# Patient Record
Sex: Male | Born: 1975 | Race: White | Hispanic: No | Marital: Married | Smoking: Current some day smoker
Health system: Southern US, Community
[De-identification: ages and names within clinical notes are randomized; demographics above are authoritative.]

## PROBLEM LIST (undated history)

## (undated) DIAGNOSIS — M549 Dorsalgia, unspecified: Secondary | ICD-10-CM

## (undated) DIAGNOSIS — M1990 Unspecified osteoarthritis, unspecified site: Secondary | ICD-10-CM

## (undated) DIAGNOSIS — G8929 Other chronic pain: Secondary | ICD-10-CM

## (undated) DIAGNOSIS — Z87442 Personal history of urinary calculi: Secondary | ICD-10-CM

## (undated) HISTORY — PX: CYST REMOVAL NECK: SHX6281

## (undated) HISTORY — PX: KNEE SURGERY: SHX244

## (undated) HISTORY — PX: FOOT SURGERY: SHX648

---

## 2003-08-14 ENCOUNTER — Other Ambulatory Visit: Payer: Self-pay

## 2005-10-20 ENCOUNTER — Emergency Department: Payer: Self-pay | Admitting: Emergency Medicine

## 2005-10-21 ENCOUNTER — Ambulatory Visit: Payer: Self-pay | Admitting: Podiatry

## 2005-11-18 ENCOUNTER — Encounter: Payer: Self-pay | Admitting: Podiatry

## 2005-12-03 ENCOUNTER — Encounter: Payer: Self-pay | Admitting: Podiatry

## 2006-04-22 ENCOUNTER — Ambulatory Visit: Payer: Self-pay | Admitting: Family Medicine

## 2006-04-23 ENCOUNTER — Ambulatory Visit: Payer: Self-pay | Admitting: Family Medicine

## 2006-06-08 ENCOUNTER — Ambulatory Visit: Payer: Self-pay | Admitting: General Practice

## 2006-07-04 ENCOUNTER — Ambulatory Visit: Payer: Self-pay | Admitting: General Practice

## 2010-05-05 HISTORY — PX: SHOULDER ARTHROSCOPY: SHX128

## 2016-01-09 ENCOUNTER — Emergency Department (HOSPITAL_COMMUNITY)
Admission: EM | Admit: 2016-01-09 | Discharge: 2016-01-09 | Disposition: A | Discharge status: Home / Self Care | Payer: Worker's Compensation | Attending: Physician Assistant | Admitting: Physician Assistant

## 2016-01-09 ENCOUNTER — Encounter (HOSPITAL_COMMUNITY): Payer: Self-pay | Admitting: Emergency Medicine

## 2016-01-09 DIAGNOSIS — X500XXA Overexertion from strenuous movement or load, initial encounter: Secondary | ICD-10-CM | POA: Insufficient documentation

## 2016-01-09 DIAGNOSIS — Y939 Activity, unspecified: Secondary | ICD-10-CM | POA: Diagnosis not present

## 2016-01-09 DIAGNOSIS — F17210 Nicotine dependence, cigarettes, uncomplicated: Secondary | ICD-10-CM | POA: Insufficient documentation

## 2016-01-09 DIAGNOSIS — Y929 Unspecified place or not applicable: Secondary | ICD-10-CM | POA: Diagnosis not present

## 2016-01-09 DIAGNOSIS — Y990 Civilian activity done for income or pay: Secondary | ICD-10-CM | POA: Insufficient documentation

## 2016-01-09 DIAGNOSIS — M545 Low back pain: Secondary | ICD-10-CM | POA: Insufficient documentation

## 2016-01-09 MED ORDER — CYCLOBENZAPRINE HCL 10 MG PO TABS: 10.0000 mg | ORAL_TABLET | Freq: Two times a day (BID) | ORAL | 0 refills | Status: DC | PRN

## 2016-01-09 MED ORDER — IBUPROFEN 800 MG PO TABS: 800.0000 mg | ORAL_TABLET | Freq: Three times a day (TID) | ORAL | 0 refills | Status: DC

## 2016-01-09 NOTE — ED Provider Notes (Signed)
[facility]-EMERGENCY DEPT Provider Note   CSN: [id number] Arrival date & time: 01/09/16  1130    By signing my name below, I, Sonum Patel, attest that this documentation has been prepared under the direction and in the presence of Teressa LowerVrinda Mariabella Nilsen, NP. Electronically Signed: Sonum Patel, Neurosurgeoncribe. 01/09/16. 12:01 PM.  History   Chief Complaint Chief Complaint  Patient presents with  . Back Pain   The history is provided by the patient. No language interpreter was used.   HPI Comments: Anthony Ayala is a 40 y.o. male who presents to the Emergency Department complaining of constant lower back pain that began while at work earlier today. Patient states he was shoveling when he felt a pulling sensation to the affected area. He has not taken any OTC medications for his symptoms. He states movement worsens his pain. He denies numbness, weakness, bowel/bladder incontinence.    No past medical history on file.  There are no active problems to display for this patient.   Past Surgical History:  Procedure Laterality Date  . CYST REMOVAL NECK    . FOOT SURGERY    . KNEE SURGERY      Home Medications    Prior to Admission medications   Not on File    Family History No family history on file.  Social History Social History  Substance Use Topics  . Smoking status: Current Some Day Smoker    Types: Cigarettes  . Smokeless tobacco: Not on file  . Alcohol use Yes     Comment: 4beers/week     Allergies   Penicillins   Review of Systems Review of Systems  Musculoskeletal: Positive for back pain.  Neurological: Negative for weakness and numbness.  All other systems reviewed and are negative.    Physical Exam Updated Vital Signs BP 134/87 (BP Location: Left Arm)   Pulse 89   Temp 98.5 F (36.9 C) (Oral)   Resp 16   Wt 220 lb (99.8 kg)   SpO2 100%   Physical Exam  Constitutional: He is oriented to person, place, and time. He appears well-developed and well-nourished. No  distress.  HENT:  Head: Normocephalic and atraumatic.  Eyes: Conjunctivae and EOM are normal.  Neck: Neck supple. No tracheal deviation present.  Cardiovascular: Normal rate.   Pulmonary/Chest: Effort normal. No respiratory distress.  Musculoskeletal: Normal range of motion.  Left lumbar paraspinal tenderness. Able to do straight leg raises. Good sensation and strength  Neurological: He is alert and oriented to person, place, and time.  Skin: Skin is warm and dry.  Psychiatric: He has a normal mood and affect. His behavior is normal.  Nursing note and vitals reviewed.    ED Treatments / Results  DIAGNOSTIC STUDIES: Oxygen Saturation is 100% on RA, normal by my interpretation.    COORDINATION OF CARE: 12:01 PM Discussed treatment plan with pt at bedside and pt agreed to plan.   Labs (all labs ordered are listed, but only abnormal results are displayed) Labs Reviewed - No data to display  EKG  EKG Interpretation None       Radiology No results found.  Procedures Procedures (including critical care time)  Medications Ordered in ED Medications - No data to display   Initial Impression / Assessment and Plan / ED Course  I have reviewed the triage vital signs and the nursing notes.  Pertinent labs & imaging results that were available during my care of the patient were reviewed by me and considered in my medical decision  making (see chart for details).  Clinical Course   No red flag symptoms. Will treat symptomatically with flexeril and ibuprofen. Discussed follow up and return precautions  Final Clinical Impressions(s) / ED Diagnoses   Final diagnoses:  None    New Prescriptions New Prescriptions   No medications on file   I personally performed the services described in this documentation, which was scribed in my presence. The recorded information has been reviewed and is accurate.    Teressa Lower, NP 01/09/16 1211    Courteney Randall An,  MD 01/11/16 1052

## 2016-01-09 NOTE — ED Triage Notes (Signed)
Pt arrives via POV from workplace where pts states he was lifting things, bent over and felt a pain on the left side of his lower back. Pt denies hx of back pain. Ambulatory, VSS.

## 2016-01-12 ENCOUNTER — Emergency Department
Admission: EM | Admit: 2016-01-12 | Discharge: 2016-01-12 | Disposition: A | Discharge status: Home / Self Care | Payer: Worker's Compensation | Attending: Emergency Medicine | Admitting: Emergency Medicine

## 2016-01-12 ENCOUNTER — Emergency Department: Payer: Worker's Compensation

## 2016-01-12 ENCOUNTER — Encounter: Payer: Self-pay | Admitting: Emergency Medicine

## 2016-01-12 DIAGNOSIS — M545 Low back pain: Secondary | ICD-10-CM | POA: Diagnosis present

## 2016-01-12 DIAGNOSIS — F17210 Nicotine dependence, cigarettes, uncomplicated: Secondary | ICD-10-CM | POA: Insufficient documentation

## 2016-01-12 DIAGNOSIS — M5136 Other intervertebral disc degeneration, lumbar region: Secondary | ICD-10-CM

## 2016-01-12 DIAGNOSIS — M5126 Other intervertebral disc displacement, lumbar region: Secondary | ICD-10-CM

## 2016-01-12 DIAGNOSIS — M5386 Other specified dorsopathies, lumbar region: Secondary | ICD-10-CM | POA: Insufficient documentation

## 2016-01-12 MED ORDER — TIZANIDINE HCL 4 MG PO CAPS: 4.0000 mg | ORAL_CAPSULE | Freq: Four times a day (QID) | ORAL | 0 refills | Status: DC | PRN

## 2016-01-12 MED ORDER — PREDNISONE 10 MG PO TABS: 50.0000 mg | ORAL_TABLET | Freq: Every day | ORAL | 0 refills | Status: DC

## 2016-01-12 NOTE — ED Provider Notes (Signed)
[facility] Emergency Department Provider Note  ____________________________________________  Time seen: Approximately 11:22 AM  I have reviewed the triage vital signs and the nursing notes.   HISTORY  Chief Complaint Back Pain    HPI Anthony Ayala is a 40 y.o. male presents for evaluation of continued low back pain. Patient [facility] days ago. And has continued pain despite Flexeril and ibuprofen. Patient reports that he was lifting trash with a shovel when he felt something pull and pop in his lower back. He describes pain as 6/10 worse when sitting or attempts to ambulate.Marland Kitchen. He feels as if his legs are going to go out. He denies any numbness tingling no radiation of pain. No perianal or groin paresthesia.   History reviewed. No pertinent past medical history.  There are no active problems to display for this patient.   Past Surgical History:  Procedure Laterality Date  . CYST REMOVAL NECK    . FOOT SURGERY    . KNEE SURGERY      Prior to Admission medications   Medication Sig Start Date End Date Taking? Authorizing Provider  ibuprofen (ADVIL,MOTRIN) 800 MG tablet Take 1 tablet (800 mg total) by mouth 3 (three) times daily. 01/09/16   Teressa LowerVrinda Pickering, NP  predniSONE (DELTASONE) 10 MG tablet Take 5 tablets (50 mg total) by mouth daily with breakfast. 01/12/16   Evangeline Dakinharles M Beers, PA-C  tiZANidine (ZANAFLEX) 4 MG capsule Take 1 capsule (4 mg total) by mouth 4 (four) times daily as needed for muscle spasms. 01/12/16   Evangeline Dakinharles M Beers, PA-C    Allergies Penicillins  History reviewed. No pertinent family history.  Social History Social History  Substance Use Topics  . Smoking status: Current Some Day Smoker    Types: Cigarettes  . Smokeless tobacco: Never Used  . Alcohol use Yes     Comment: 4beers/week    Review of Systems Constitutional: No fever/chills Cardiovascular: Denies chest pain. Respiratory: Denies  shortness of breath. Gastrointestinal: No abdominal pain.  No nausea, no vomiting.  No diarrhea.  No constipation. Genitourinary: Negative for dysuria. Musculoskeletal: Positive for low back pain. Skin: Negative for rash. Neurological: Negative for headaches, focal weakness or numbness.  10-point ROS otherwise negative.  ____________________________________________   PHYSICAL EXAM:  VITAL SIGNS: ED Triage Vitals  Enc Vitals Group     BP 01/12/16 1117 136/84     Pulse Rate 01/12/16 1117 77     Resp 01/12/16 1117 18     Temp 01/12/16 1117 98.4 F (36.9 C)     Temp Source 01/12/16 1117 Oral     SpO2 01/12/16 1117 99 %     Weight 01/12/16 1117 220 lb (99.8 kg)     Height 01/12/16 1117 5\' 8"  (1.727 m)     Head Circumference --      Peak Flow --      Pain Score 01/12/16 1118 6     Pain Loc --      Pain Edu? --      Excl. in GC? --     Constitutional: Alert and oriented. Well appearing and in no acute distress. Cardiovascular: Normal rate, regular rhythm. Grossly normal heart sounds.  Good peripheral circulation. Respiratory: Normal respiratory effort.  No retractions. Lungs CTAB. Gastrointestinal: Soft and nontender. No distention. No abdominal bruits. No CVA tenderness. Musculoskeletal: Point tenderness to the lumbar spine. Straight leg raise positive on the left greater than on the right. Distally neurovascularly  intact. Neurologic:  Normal speech and language. No gross focal neurologic deficits are appreciated. No gait instability. Skin:  Skin is warm, dry and intact. No rash noted. Psychiatric: Mood and affect are normal. Speech and behavior are normal.  ____________________________________________   LABS (all labs ordered are listed, but only abnormal results are displayed)  Labs Reviewed - No data to display ____________________________________________  EKG   ____________________________________________  RADIOLOGY  IMPRESSION:  Mild degenerative disc disease  is noted at L3-4. Moderate  broad-based posterior disc bulging is noted at this level which  results in mild central spinal canal stenosis. No fracture or  spondylolisthesis is noted.    ____________________________________________   PROCEDURES  Procedure(s) performed: None  Critical Care performed: No  ____________________________________________   INITIAL IMPRESSION / ASSESSMENT AND PLAN / ED COURSE  Pertinent labs & imaging results that were available during my care of the patient were reviewed by me and considered in my medical decision making (see chart for details). Review of the  CSRS was performed in accordance of the [facility] prior to dispensing any controlled drugs.  Discussed all clinical findings with patient. Rx given for prednisone five-day boost and change muscle relaxers to Zanaflex. Patient follow-up with orthopedics as needed.  Clinical Course    ____________________________________________   FINAL CLINICAL IMPRESSION(S) / ED DIAGNOSES  Final diagnoses:  Bulging lumbar disc     This chart was dictated using voice recognition software/Dragon. Despite best efforts to proofread, errors can occur which can change the meaning. Any change was purely unintentional.    Evangeline Dakin, PA-C 01/12/16 1249    Jeanmarie Plant, MD 01/12/16 [phone]

## 2016-01-12 NOTE — ED Notes (Signed)
Returned from CT.

## 2016-01-12 NOTE — ED Triage Notes (Signed)
Was cleaning up trash at work 3 days ago and when he threw a shovel in truck, he felt like something in back went. Was seen and told pulled muscle but not better.

## 2016-01-12 NOTE — ED Triage Notes (Signed)
Last dose of Flexeril was at 0300 and Ibuprofen at 2300.

## 2016-01-12 NOTE — ED Triage Notes (Signed)
Pt states on Wednesday he was shoveling trash and when he threw up the shovel his back started hurting. Pt states he has been taking Ibuprofen and Flexeril since he was seen at [facility] on Wednesday.

## 2016-01-21 ENCOUNTER — Emergency Department
Admission: EM | Admit: 2016-01-21 | Discharge: 2016-01-21 | Disposition: A | Discharge status: Home / Self Care | Payer: Worker's Compensation | Attending: Emergency Medicine | Admitting: Emergency Medicine

## 2016-01-21 ENCOUNTER — Emergency Department: Payer: Worker's Compensation

## 2016-01-21 ENCOUNTER — Encounter: Payer: Self-pay | Admitting: Emergency Medicine

## 2016-01-21 DIAGNOSIS — Z791 Long term (current) use of non-steroidal anti-inflammatories (NSAID): Secondary | ICD-10-CM | POA: Diagnosis not present

## 2016-01-21 DIAGNOSIS — M5126 Other intervertebral disc displacement, lumbar region: Secondary | ICD-10-CM | POA: Diagnosis not present

## 2016-01-21 DIAGNOSIS — M5136 Other intervertebral disc degeneration, lumbar region: Secondary | ICD-10-CM

## 2016-01-21 DIAGNOSIS — R29898 Other symptoms and signs involving the musculoskeletal system: Secondary | ICD-10-CM

## 2016-01-21 DIAGNOSIS — F17210 Nicotine dependence, cigarettes, uncomplicated: Secondary | ICD-10-CM | POA: Insufficient documentation

## 2016-01-21 DIAGNOSIS — R531 Weakness: Secondary | ICD-10-CM | POA: Diagnosis present

## 2016-01-21 HISTORY — DX: Other chronic pain: G89.29

## 2016-01-21 HISTORY — DX: Dorsalgia, unspecified: M54.9

## 2016-01-21 MED ORDER — DIAZEPAM 2 MG PO TABS: 2.0000 mg | ORAL_TABLET | Freq: Three times a day (TID) | ORAL | 0 refills | Status: DC | PRN

## 2016-01-21 MED ORDER — MELOXICAM 15 MG PO TABS: 15.0000 mg | ORAL_TABLET | Freq: Every day | ORAL | 0 refills | Status: DC

## 2016-01-21 MED ORDER — KETOROLAC TROMETHAMINE 30 MG/ML IJ SOLN
30.0000 mg | Freq: Once | INTRAMUSCULAR | Status: AC
  Administered 2016-01-21: 30 mg via INTRAMUSCULAR
  Filled 2016-01-21: qty 1

## 2016-01-21 MED ORDER — DIAZEPAM 5 MG PO TABS
5.0000 mg | ORAL_TABLET | Freq: Once | ORAL | Status: AC
  Administered 2016-01-21: 5 mg via ORAL
  Filled 2016-01-21: qty 1

## 2016-01-21 NOTE — ED Notes (Signed)
To MRI via stretcher  

## 2016-01-21 NOTE — ED Triage Notes (Signed)
Brought in via ems with lower back pain   States pain this am went into left leg..Marland Kitchen

## 2016-01-21 NOTE — ED Notes (Signed)
Back from MRI.

## 2016-01-21 NOTE — Discharge Instructions (Signed)
Continue the prednisone. Stop the flexeril and ibuprofen. Schedule an appointment with neurosurgery and arrange for your PT.

## 2016-01-21 NOTE — ED Notes (Signed)
Spoke with MRI tech  Screening per phone by pt  ..Anthony Ayala

## 2016-01-21 NOTE — ED Provider Notes (Signed)
[facility] Emergency Department Provider Note ____________________________________________  Time seen: Approximately 8:30 AM  I have reviewed the triage vital signs and the nursing notes.   HISTORY  Chief Complaint Back Pain    HPI Anthony Ayala is a 40 y.o. male who presents to the ER for evaluation of lower back pain that radiates into his left leg. Pain started9/3/17 while shoveling. He denies loss of bowel or bladder control or saddle anesthesia. He took flexeril and ibuprofen last night and prednisone this morning. CT done here 01/09/16 showing "bulging discs." He has follow up with neurosurgery and the next step is "MRI." Pain had improved until this morning. He stepped down into the yard and developed sudden, sharp, pain in the lower back and is now radiating into the left leg causing weakness and inability to bear weight. Prior to this morning, pain did not radiate.   Past Medical History:  Diagnosis Date  . Chronic back pain     There are no active problems to display for this patient.   Past Surgical History:  Procedure Laterality Date  . CYST REMOVAL NECK    . FOOT SURGERY    . KNEE SURGERY      Prior to Admission medications   Medication Sig Start Date End Date Taking? Authorizing Provider  diazepam (VALIUM) 2 MG tablet Take 1 tablet (2 mg total) by mouth every 8 (eight) hours as needed. 01/21/16   Chinita Pester, FNP  ibuprofen (ADVIL,MOTRIN) 800 MG tablet Take 1 tablet (800 mg total) by mouth 3 (three) times daily. 01/09/16   Teressa Lower, NP  meloxicam (MOBIC) 15 MG tablet Take 1 tablet (15 mg total) by mouth daily. 01/21/16   Chinita Pester, FNP  predniSONE (DELTASONE) 10 MG tablet Take 5 tablets (50 mg total) by mouth daily with breakfast. 01/12/16   Evangeline Dakin, PA-C  tiZANidine (ZANAFLEX) 4 MG capsule Take 1 capsule (4 mg total) by mouth 4 (four) times daily as needed for muscle spasms. 01/12/16   Evangeline Dakin, PA-C     Allergies Penicillins  No family history on file.  Social History Social History  Substance Use Topics  . Smoking status: Current Some Day Smoker    Types: Cigarettes  . Smokeless tobacco: Never Used  . Alcohol use Yes     Comment: 4beers/week    Review of Systems Constitutional: No recent illness. Cardiovascular: Denies chest pain or palpitations. Respiratory: Denies shortness of breath. Musculoskeletal: Pain in lower back with radiation into the left leg. Skin: Negative for rash, wound, lesion. Neurological: Negative for focal weakness or numbness.  ____________________________________________   PHYSICAL EXAM:  VITAL SIGNS: ED Triage Vitals  Enc Vitals Group     BP 01/21/16 0813 136/76     Pulse Rate 01/21/16 0813 79     Resp 01/21/16 0813 20     Temp 01/21/16 0813 97.8 F (36.6 C)     Temp Source 01/21/16 0813 Oral     SpO2 01/21/16 0813 97 %     Weight 01/21/16 0811 220 lb (99.8 kg)     Height 01/21/16 0811 5\' 8"  (1.727 m)     Head Circumference --      Peak Flow --      Pain Score 01/21/16 0811 8     Pain Loc --      Pain Edu? --      Excl. in GC? --     Constitutional: Alert and oriented. Well appearing and  in no acute distress. Eyes: Conjunctivae are normal. EOMI. Head: Atraumatic. Neck: No stridor.  Respiratory: Normal respiratory effort.   Musculoskeletal: Diffuse lower lumbar tenderness without focal midline tenderness of the lumbar spine. Good sensation, straight leg raise triggers pain at about 45*.  Neurologic:  Normal speech and language. No gross focal neurologic deficits are appreciated. Speech is normal. Straight left leg raise barely possible due to pain/weakness. Skin:  Skin is warm, dry and intact. Atraumatic. Psychiatric: Mood and affect are normal. Speech and behavior are normal.  ____________________________________________   LABS (all labs ordered are listed, but only abnormal results are displayed)  Labs Reviewed - No data  to display ____________________________________________  RADIOLOGY  Previous CT results reviewed.  MR Lumbar Spine: IMPRESSION: 1. At L3-4 there is a broad disc protrusion with mild caudal migration of disc material effacing the ventral CSF space. Bilateral lateral recess stenosis and mild-moderate spinal stenosis. 2. At L4-5 there is a broad central disc protrusion with bilateral lateral recess stenosis, left greater than right. ____________________________________________   PROCEDURES  Procedure(s) performed: None   ____________________________________________   INITIAL IMPRESSION / ASSESSMENT AND PLAN / ED COURSE  Will give toradol injection and valium by mouth and reassess pain.    Clinical Course  Comment By Time  Little to no improvement after medications. Patient states he is not able to walk or bear weight. Will order MR Lumbar Chinita PesterCari B Rawley Harju, FNP 09/18 0947  Observed ambulating in the hallway with antalgic but steady gait. Chinita PesterCari B Mao Lockner, FNP 09/18 1140    Pertinent labs & imaging results that were available during my care of the patient were reviewed by me and considered in my medical decision making (see chart for details).  Patient will be discharged home to follow up with neurosurgery. He was advised to follow through with the physical therapy as previously recommended. He is to return to the ER for symptoms that change or worsen or for new concerns.  ____________________________________________   FINAL CLINICAL IMPRESSION(S) / ED DIAGNOSES  Final diagnoses:  Weakness of left leg  Bulging lumbar disc       Chinita PesterCari B Ryenne Lynam, FNP 01/22/16 [id number]    Nita Sicklearolina Veronese, MD 01/22/16 [phone]

## 2016-04-01 ENCOUNTER — Encounter
Admission: RE | Admit: 2016-04-01 | Discharge: 2016-04-01 | Disposition: A | Discharge status: Home / Self Care | Payer: Medicaid Other | Source: Ambulatory Visit | Attending: Neurological Surgery | Admitting: Neurological Surgery

## 2016-04-01 ENCOUNTER — Ambulatory Visit
Admission: RE | Admit: 2016-04-01 | Discharge: 2016-04-01 | Disposition: A | Discharge status: Home / Self Care | Payer: Medicaid Other | Source: Ambulatory Visit | Attending: Neurological Surgery | Admitting: Neurological Surgery

## 2016-04-01 DIAGNOSIS — Z01818 Encounter for other preprocedural examination: Secondary | ICD-10-CM

## 2016-04-01 DIAGNOSIS — J9811 Atelectasis: Secondary | ICD-10-CM | POA: Diagnosis not present

## 2016-04-01 DIAGNOSIS — M5416 Radiculopathy, lumbar region: Secondary | ICD-10-CM | POA: Insufficient documentation

## 2016-04-01 DIAGNOSIS — Z01810 Encounter for preprocedural cardiovascular examination: Secondary | ICD-10-CM | POA: Diagnosis not present

## 2016-04-01 DIAGNOSIS — Z01812 Encounter for preprocedural laboratory examination: Secondary | ICD-10-CM | POA: Insufficient documentation

## 2016-04-01 HISTORY — DX: Unspecified osteoarthritis, unspecified site: M19.90

## 2016-04-01 HISTORY — DX: Personal history of urinary calculi: Z87.442

## 2016-04-01 LAB — BASIC METABOLIC PANEL
Anion gap: 7 (ref 5–15)
BUN: 14 mg/dL (ref 6–20)
CO2: 25 mmol/L (ref 22–32)
Calcium: 9.3 mg/dL (ref 8.9–10.3)
Chloride: 106 mmol/L (ref 101–111)
Creatinine, Ser: 0.7 mg/dL (ref 0.61–1.24)
GFR calc Af Amer: >60 mL/min (ref >60)
GLUCOSE: 101 mg/dL — AB (ref 65–99)
POTASSIUM: 4 mmol/L (ref 3.5–5.1)
Sodium: 138 mmol/L (ref 135–145)

## 2016-04-01 LAB — CBC
HEMATOCRIT: 42.5 % (ref 40.0–52.0)
Hemoglobin: 14.9 g/dL (ref 13.0–18.0)
MCH: 31.4 pg (ref 26.0–34.0)
MCHC: 35 g/dL (ref 32.0–36.0)
MCV: 89.8 fL (ref 80.0–100.0)
Platelets: 225 10*3/uL (ref 150–440)
RBC: 4.74 MIL/uL (ref 4.40–5.90)
RDW: 13.3 % (ref 11.5–14.5)
WBC: 6 10*3/uL (ref 3.8–10.6)

## 2016-04-01 LAB — URINALYSIS COMPLETE WITH MICROSCOPIC (ARMC ONLY)
BILIRUBIN URINE: NEGATIVE
Bacteria, UA: NONE SEEN
GLUCOSE, UA: NEGATIVE mg/dL
Hgb urine dipstick: NEGATIVE
Leukocytes, UA: NEGATIVE
Nitrite: NEGATIVE
Protein, ur: NEGATIVE mg/dL
Specific Gravity, Urine: 1.024 (ref 1.005–1.030)
Squamous Epithelial / LPF: NONE SEEN
pH: 5 (ref 5.0–8.0)

## 2016-04-01 LAB — TYPE AND SCREEN
ABO/RH(D): A POS
ANTIBODY SCREEN: NEGATIVE

## 2016-04-01 LAB — PROTIME-INR
INR: 0.9
Prothrombin Time: 12.1 seconds (ref 11.4–15.2)

## 2016-04-01 LAB — SURGICAL PCR SCREEN
MRSA, PCR: NEGATIVE
STAPHYLOCOCCUS AUREUS: NEGATIVE

## 2016-04-01 LAB — APTT: aPTT: 26 seconds (ref 24–36)

## 2016-04-01 NOTE — Patient Instructions (Signed)
Your procedure is scheduled on: Wednesday April 09, 2016 Su procedimiento est programado para: Report to MEDICAL MALL REVOLVING DOOR GO TO SECOND FLOOR Presntese a: To find out your arrival time please call [phone] between 1PM - 3PM on Tuesday April 08, 2016  Para saber su hora de llegada por favor llame al [phone] entre la 1PM - 3PM el da:  Remember: Instructions that are not followed completely may result in serious medical risk, up to and including death, or upon the discretion of your surgeon and anesthesiologist your surgery may need to be rescheduled.  Recuerde: Las instrucciones que no se siguen completamente Armed forces logistics/support/administrative officerpueden resultar en un riesgo de salud grave, incluyendo hasta la [place] o a discrecin de su cirujano y Scientific laboratory techniciananestesilogo, su ciruga se puede posponer.   __X__ 1. Do not eat food or drink liquids after midnight. No gum chewing or hard candies.  No coma alimentos ni tome lquidos despus de la medianoche.  No mastique chicle ni caramelos  duros.     __X__ 2. No alcohol for 24 hours before or after surgery.    No tome alcohol durante las 24 horas antes ni despus de la [place].   ____ 3. Bring all medications with you on the day of surgery if instructed.    Lleve todos los medicamentos con usted el da de su ciruga si se le ha indicado as.   _X_ 4. Notify your doctor if there is any change in your medical condition (cold, fever,                             infections).    Informe a su mdico si hay algn cambio en su condicin mdica (resfriado, fiebre, infecciones).   Do not wear jewelry, make-up, hairpins, clips or nail polish.  No use joyas, maquillajes, pinzas/ganchos para el cabello ni esmalte de uas.  Do not wear lotions, powders, or perfumes. You may wear deodorant.  No use lociones, polvos o perfumes.  Puede usar desodorante.    Do not shave 48 hours prior to surgery. Men may shave face and neck.  No se afeite 48 horas antes de la [place].  Los  hombres pueden Commercial Metals Companyafeitarse la cara y el cuello.   Do not bring valuables to the hospital.   No lleve objetos de valor al hospital.  [facility] is not responsible for any belongings or valuables.  [place] no se hace responsable de ningn tipo de pertenencias u objetos de Licensed conveyancervalor.               Contacts, dentures or bridgework may not be worn into surgery.  Los lentes de [place]contacto, las dentaduras postizas o puentes no se pueden usar en la [place].  Leave your suitcase in the car. After surgery it may be brought to your room.  Deje su maleta en el auto.  Despus de la ciruga podr traerla a su habitacin.  For patients admitted to the hospital, discharge time is determined by your treatment team.  Para los pacientes que sean ingresados al hospital, el tiempo en el cual se le dar de alta es determinado por su                equipo de [place]ratamiento.   Patients discharged the day of surgery will not be allowed to drive home. A los pacientes que se les da de alta el mismo da de la ciruga no se les Investment banker, operationalpermitir conducir a  casa.   Please read over the following fact sheets that you were given: Por favor lea las siguientes hojas de informacin que le dieron:   MRSA INFORMATION AND CHG INSTRUCTIONS   ____ Take these medicines the morning of surgery with A SIP OF WATER:          Johnson & Johnsonome estas medicinas la maana de la ciruga con UN SORBO DE AGUA:  1. NO MED  2.   3.   4.       5.  6.  ____ Fleet Enema (as directed)          Enema de Fleet (segn lo indicado)    _X___ Use CHG Soap as directed          Utilice el jabn de CHG segn lo indicado  ____ Use inhalers on the day of surgery          Use los inhaladores el da de la ciruga  ____ Stop metformin 2 days prior to surgery          Deje de tomar el metformin 2 das antes de la ciruga    ____ Take 1/2 of usual insulin dose the night before surgery and none on the morning of surgery           Tome la mitad de la dosis habitual de  insulina la noche antes de la [place] y no tome nada en la maana de la             ciruga  ____ Stop Coumadin/Plavix/aspirin on          Deje de tomar el Coumadin/Plavix/aspirina el da:  __X__ Stop Anti-inflammatories on 04-02-2016 UNTIL AFTER SURGERY USE TYLENOL          Deje de tomar antiinflamatorios el da:   ____ Stop supplements until after surgery            Deje de tomar suplementos hasta despus de la ciruga  ____ Bring C-Pap to the hospital          Lleve el C-Pap al hospital

## 2016-04-01 NOTE — Pre-Procedure Instructions (Signed)
CXR OK BY DR Henrene HawkingKEPHART

## 2016-04-09 ENCOUNTER — Ambulatory Visit: Payer: Medicaid Other | Admitting: Anesthesiology

## 2016-04-09 ENCOUNTER — Encounter: Payer: Self-pay | Admitting: *Deleted

## 2016-04-09 ENCOUNTER — Observation Stay
Admission: RE | Admit: 2016-04-09 | Discharge: 2016-04-10 | Disposition: A | Discharge status: Home / Self Care | Payer: Medicaid Other | Source: Ambulatory Visit | Attending: Neurological Surgery | Admitting: Neurological Surgery

## 2016-04-09 ENCOUNTER — Encounter
Admission: RE | Disposition: A | Discharge status: Home / Self Care | Payer: Self-pay | Source: Ambulatory Visit | Attending: Neurological Surgery

## 2016-04-09 ENCOUNTER — Ambulatory Visit: Payer: Medicaid Other

## 2016-04-09 DIAGNOSIS — F17200 Nicotine dependence, unspecified, uncomplicated: Secondary | ICD-10-CM | POA: Diagnosis not present

## 2016-04-09 DIAGNOSIS — Z880 Allergy status to penicillin: Secondary | ICD-10-CM | POA: Insufficient documentation

## 2016-04-09 DIAGNOSIS — M5416 Radiculopathy, lumbar region: Secondary | ICD-10-CM | POA: Diagnosis present

## 2016-04-09 DIAGNOSIS — Z79899 Other long term (current) drug therapy: Secondary | ICD-10-CM | POA: Diagnosis not present

## 2016-04-09 DIAGNOSIS — M5116 Intervertebral disc disorders with radiculopathy, lumbar region: Principal | ICD-10-CM | POA: Insufficient documentation

## 2016-04-09 DIAGNOSIS — Z419 Encounter for procedure for purposes other than remedying health state, unspecified: Secondary | ICD-10-CM

## 2016-04-09 DIAGNOSIS — R262 Difficulty in walking, not elsewhere classified: Secondary | ICD-10-CM

## 2016-04-09 HISTORY — PX: LUMBAR LAMINECTOMY/DECOMPRESSION MICRODISCECTOMY: SHX5026

## 2016-04-09 LAB — CREATININE, SERUM
Creatinine, Ser: 0.97 mg/dL (ref 0.61–1.24)
GFR calc Af Amer: >60 mL/min (ref >60)
GFR calc non Af Amer: >60 mL/min (ref >60)

## 2016-04-09 LAB — CBC
HCT: 44.5 % (ref 40.0–52.0)
Hemoglobin: 15.1 g/dL (ref 13.0–18.0)
MCH: 30.7 pg (ref 26.0–34.0)
MCHC: 33.9 g/dL (ref 32.0–36.0)
MCV: 90.5 fL (ref 80.0–100.0)
PLATELETS: 248 10*3/uL (ref 150–440)
RBC: 4.91 MIL/uL (ref 4.40–5.90)
RDW: 13.5 % (ref 11.5–14.5)
WBC: 11 10*3/uL — ABNORMAL HIGH (ref 3.8–10.6)

## 2016-04-09 LAB — ABO/RH: ABO/RH(D): A POS

## 2016-04-09 SURGERY — LUMBAR LAMINECTOMY/DECOMPRESSION MICRODISCECTOMY 2 LEVELS
Anesthesia: General

## 2016-04-09 MED ORDER — ALUM & MAG HYDROXIDE-SIMETH 200-200-20 MG/5ML PO SUSP: 30.0000 mL | Freq: Four times a day (QID) | ORAL | Status: DC | PRN

## 2016-04-09 MED ORDER — SODIUM CHLORIDE 0.9 % IV SOLN
INTRAVENOUS | Status: DC
  Administered 2016-04-09: 17:00:00 via INTRAVENOUS

## 2016-04-09 MED ORDER — ACETAMINOPHEN 10 MG/ML IV SOLN
INTRAVENOUS | Status: AC
  Filled 2016-04-09: qty 100

## 2016-04-09 MED ORDER — DEXAMETHASONE SODIUM PHOSPHATE 10 MG/ML IJ SOLN
INTRAMUSCULAR | Status: DC | PRN
Start: 2016-04-09 — End: 2016-04-09
  Administered 2016-04-09 (×2): 4 mg via INTRAVENOUS

## 2016-04-09 MED ORDER — METHYLPREDNISOLONE ACETATE 40 MG/ML IJ SUSP
INTRAMUSCULAR | Status: DC | PRN
  Administered 2016-04-09: 1 mL

## 2016-04-09 MED ORDER — METHYLPREDNISOLONE ACETATE 40 MG/ML IJ SUSP
INTRAMUSCULAR | Status: AC
  Filled 2016-04-09: qty 1

## 2016-04-09 MED ORDER — SODIUM CHLORIDE 0.9 % IJ SOLN
INTRAMUSCULAR | Status: AC
  Filled 2016-04-09: qty 50

## 2016-04-09 MED ORDER — ACETAMINOPHEN 10 MG/ML IV SOLN
INTRAVENOUS | Status: DC | PRN
  Administered 2016-04-09: 1000 mg via INTRAVENOUS

## 2016-04-09 MED ORDER — GENTAMICIN SULFATE 40 MG/ML IJ SOLN
600.0000 mg | Freq: Once | INTRAVENOUS | Status: AC
  Administered 2016-04-09: 600 mg via INTRAVENOUS
  Filled 2016-04-09: qty 15

## 2016-04-09 MED ORDER — SODIUM CHLORIDE 0.9 % IJ SOLN
INTRAMUSCULAR | Status: AC
  Filled 2016-04-09: qty 10

## 2016-04-09 MED ORDER — SODIUM CHLORIDE 0.9 % IV SOLN
1500.0000 mg | Freq: Once | INTRAVENOUS | Status: AC
  Administered 2016-04-09: 1500 mg via INTRAVENOUS
  Filled 2016-04-09: qty 1500

## 2016-04-09 MED ORDER — FAMOTIDINE 20 MG PO TABS
ORAL_TABLET | ORAL | Status: AC
  Administered 2016-04-09: 20 mg via ORAL
  Filled 2016-04-09: qty 1

## 2016-04-09 MED ORDER — SUCCINYLCHOLINE CHLORIDE 20 MG/ML IJ SOLN
INTRAMUSCULAR | Status: DC | PRN
  Administered 2016-04-09: 100 mg via INTRAVENOUS

## 2016-04-09 MED ORDER — VANCOMYCIN HCL IN DEXTROSE 1-5 GM/200ML-% IV SOLN
1000.0000 mg | Freq: Once | INTRAVENOUS | Status: AC
  Administered 2016-04-09: 1000 mg via INTRAVENOUS

## 2016-04-09 MED ORDER — THROMBIN 5000 UNITS EX SOLR
CUTANEOUS | Status: AC
  Filled 2016-04-09: qty 5000

## 2016-04-09 MED ORDER — ACETAMINOPHEN 325 MG PO TABS: 650.0000 mg | ORAL_TABLET | ORAL | Status: DC | PRN

## 2016-04-09 MED ORDER — THROMBIN 5000 UNITS EX SOLR
CUTANEOUS | Status: DC | PRN
  Administered 2016-04-09: 5000 [IU] via TOPICAL

## 2016-04-09 MED ORDER — BACITRACIN 50000 UNITS IM SOLR
INTRAMUSCULAR | Status: DC | PRN
  Administered 2016-04-09: 1000 mL

## 2016-04-09 MED ORDER — ONDANSETRON HCL 4 MG/2ML IJ SOLN: 4.0000 mg | INTRAMUSCULAR | Status: DC | PRN

## 2016-04-09 MED ORDER — SODIUM CHLORIDE 0.9% FLUSH: 3.0000 mL | Freq: Two times a day (BID) | INTRAVENOUS | Status: DC

## 2016-04-09 MED ORDER — DOCUSATE SODIUM 100 MG PO CAPS
100.0000 mg | ORAL_CAPSULE | Freq: Two times a day (BID) | ORAL | Status: DC
  Administered 2016-04-09 – 2016-04-10 (×2): 100 mg via ORAL
  Filled 2016-04-09 (×2): qty 1

## 2016-04-09 MED ORDER — SODIUM CHLORIDE 0.9 % IV SOLN
INTRAVENOUS | Status: DC | PRN
  Administered 2016-04-09: 120 mL

## 2016-04-09 MED ORDER — ACETAMINOPHEN 650 MG RE SUPP: 650.0000 mg | RECTAL | Status: DC | PRN

## 2016-04-09 MED ORDER — FENTANYL CITRATE (PF) 100 MCG/2ML IJ SOLN
INTRAMUSCULAR | Status: DC | PRN
  Administered 2016-04-09: 150 ug via INTRAVENOUS
  Administered 2016-04-09 (×5): 50 ug via INTRAVENOUS

## 2016-04-09 MED ORDER — BISACODYL 5 MG PO TBEC: 5.0000 mg | DELAYED_RELEASE_TABLET | Freq: Every day | ORAL | Status: DC | PRN

## 2016-04-09 MED ORDER — ONDANSETRON HCL 4 MG/2ML IJ SOLN
INTRAMUSCULAR | Status: DC | PRN
  Administered 2016-04-09: 4 mg via INTRAVENOUS

## 2016-04-09 MED ORDER — HYDROCODONE-ACETAMINOPHEN 5-325 MG PO TABS
1.0000 | ORAL_TABLET | ORAL | Status: DC | PRN
Start: 2016-04-09 — End: 2016-04-10
  Administered 2016-04-09: 1 via ORAL
  Administered 2016-04-10: 2 via ORAL
  Filled 2016-04-09: qty 2
  Filled 2016-04-09: qty 1

## 2016-04-09 MED ORDER — FENTANYL CITRATE (PF) 100 MCG/2ML IJ SOLN
25.0000 ug | INTRAMUSCULAR | Status: DC | PRN
  Administered 2016-04-09 (×4): 25 ug via INTRAVENOUS

## 2016-04-09 MED ORDER — ROCURONIUM BROMIDE 100 MG/10ML IV SOLN
INTRAVENOUS | Status: DC | PRN
  Administered 2016-04-09: 40 mg via INTRAVENOUS
  Administered 2016-04-09: 10 mg via INTRAVENOUS

## 2016-04-09 MED ORDER — SENNOSIDES-DOCUSATE SODIUM 8.6-50 MG PO TABS: 1.0000 | ORAL_TABLET | Freq: Every evening | ORAL | Status: DC | PRN

## 2016-04-09 MED ORDER — ONDANSETRON HCL 4 MG/2ML IJ SOLN: 4.0000 mg | Freq: Once | INTRAMUSCULAR | Status: DC | PRN

## 2016-04-09 MED ORDER — SODIUM CHLORIDE 0.9 % IV SOLN: 250.0000 mL | INTRAVENOUS | Status: DC

## 2016-04-09 MED ORDER — GELATIN ABSORBABLE 12-7 MM EX MISC
CUTANEOUS | Status: AC
  Filled 2016-04-09: qty 1

## 2016-04-09 MED ORDER — OXYCODONE-ACETAMINOPHEN 5-325 MG PO TABS
1.0000 | ORAL_TABLET | ORAL | Status: DC | PRN
  Administered 2016-04-09 – 2016-04-10 (×2): 2 via ORAL
  Filled 2016-04-09 (×2): qty 2

## 2016-04-09 MED ORDER — FENTANYL CITRATE (PF) 100 MCG/2ML IJ SOLN
INTRAMUSCULAR | Status: AC
  Administered 2016-04-09: 25 ug via INTRAVENOUS
  Filled 2016-04-09: qty 2

## 2016-04-09 MED ORDER — BUPIVACAINE HCL (PF) 0.5 % IJ SOLN
INTRAMUSCULAR | Status: AC
  Filled 2016-04-09: qty 30

## 2016-04-09 MED ORDER — BUPIVACAINE HCL (PF) 0.5 % IJ SOLN
INTRAMUSCULAR | Status: AC
Start: 2016-04-09 — End: 2016-04-09
  Filled 2016-04-09: qty 30

## 2016-04-09 MED ORDER — LACTATED RINGERS IV SOLN
INTRAVENOUS | Status: DC
  Administered 2016-04-09: 09:00:00 via INTRAVENOUS

## 2016-04-09 MED ORDER — FAMOTIDINE 20 MG PO TABS
20.0000 mg | ORAL_TABLET | Freq: Once | ORAL | Status: AC
  Administered 2016-04-09: 20 mg via ORAL

## 2016-04-09 MED ORDER — KETOROLAC TROMETHAMINE 30 MG/ML IJ SOLN: 30.0000 mg | Freq: Once | INTRAMUSCULAR | Status: DC

## 2016-04-09 MED ORDER — BACITRACIN 50000 UNITS IM SOLR
INTRAMUSCULAR | Status: AC
  Filled 2016-04-09: qty 1

## 2016-04-09 MED ORDER — HYDROMORPHONE HCL 1 MG/ML IJ SOLN: 0.5000 mg | INTRAMUSCULAR | Status: DC | PRN

## 2016-04-09 MED ORDER — PHENOL 1.4 % MT LIQD
1.0000 | OROMUCOSAL | Status: DC | PRN
  Filled 2016-04-09: qty 177

## 2016-04-09 MED ORDER — SUGAMMADEX SODIUM 200 MG/2ML IV SOLN
INTRAVENOUS | Status: DC | PRN
  Administered 2016-04-09: 100 mg via INTRAVENOUS

## 2016-04-09 MED ORDER — MENTHOL 3 MG MT LOZG
1.0000 | LOZENGE | OROMUCOSAL | Status: DC | PRN
  Filled 2016-04-09: qty 9

## 2016-04-09 MED ORDER — HEPARIN SODIUM (PORCINE) 5000 UNIT/ML IJ SOLN
5000.0000 [IU] | Freq: Three times a day (TID) | INTRAMUSCULAR | Status: DC
  Administered 2016-04-09 – 2016-04-10 (×2): 5000 [IU] via SUBCUTANEOUS
  Filled 2016-04-09 (×2): qty 1

## 2016-04-09 MED ORDER — BUPIVACAINE-EPINEPHRINE (PF) 0.5% -1:200000 IJ SOLN
INTRAMUSCULAR | Status: DC | PRN
  Administered 2016-04-09: 10 mL via PERINEURAL

## 2016-04-09 MED ORDER — GELATIN ABSORBABLE 12-7 MM EX MISC
CUTANEOUS | Status: DC | PRN
  Administered 2016-04-09: 1

## 2016-04-09 MED ORDER — DIAZEPAM 5 MG PO TABS: 5.0000 mg | ORAL_TABLET | Freq: Four times a day (QID) | ORAL | Status: DC | PRN

## 2016-04-09 MED ORDER — MIDAZOLAM HCL 2 MG/2ML IJ SOLN
INTRAMUSCULAR | Status: DC | PRN
  Administered 2016-04-09: 2 mg via INTRAVENOUS

## 2016-04-09 MED ORDER — BUPIVACAINE LIPOSOME 1.3 % IJ SUSP
INTRAMUSCULAR | Status: AC
  Filled 2016-04-09: qty 20

## 2016-04-09 MED ORDER — PROPOFOL 10 MG/ML IV BOLUS
INTRAVENOUS | Status: DC | PRN
  Administered 2016-04-09: 200 mg via INTRAVENOUS

## 2016-04-09 MED ORDER — SODIUM CHLORIDE 0.9% FLUSH: 3.0000 mL | INTRAVENOUS | Status: DC | PRN

## 2016-04-09 MED ORDER — EPINEPHRINE PF 1 MG/ML IJ SOLN
INTRAMUSCULAR | Status: AC
  Filled 2016-04-09: qty 1

## 2016-04-09 MED ORDER — VANCOMYCIN HCL IN DEXTROSE 1-5 GM/200ML-% IV SOLN
INTRAVENOUS | Status: AC
  Filled 2016-04-09: qty 200

## 2016-04-09 SURGICAL SUPPLY — 67 items
AGENT HMST MTR 8 SURGIFLO (HEMOSTASIS) ×2
APL SRG 60D 8 XTD TIP BNDBL (TIP)
BAND RUBBER 3X1/6 STRL (MISCELLANEOUS) ×1 IMPLANT
BAND RUBBER 3X1/6 TAN STRL (MISCELLANEOUS) ×3 IMPLANT
BLADE BOVIE TIP EXT 4 (BLADE) ×3 IMPLANT
BLADE CLIPPER SURG (BLADE) ×2 IMPLANT
BUR NEURO DRILL SOFT 3.0X3.8M (BURR) ×3 IMPLANT
CANISTER SUCT 1200ML W/VALVE (MISCELLANEOUS) ×3 IMPLANT
CHLORAPREP W/TINT 26ML (MISCELLANEOUS) ×6 IMPLANT
CLOSURE WOUND 1/2 X4 (GAUZE/BANDAGES/DRESSINGS)
CNTNR SPEC 2.5X3XGRAD LEK (MISCELLANEOUS) ×1
CONT SPEC 4OZ STER OR WHT (MISCELLANEOUS) ×2
CONT SPEC 4OZ STRL OR WHT (MISCELLANEOUS) ×1
CONTAINER SPEC 2.5X3XGRAD LEK (MISCELLANEOUS) ×1 IMPLANT
COUNTER NEEDLE 20/40 LG (NEEDLE) ×1 IMPLANT
COVER LIGHT HANDLE STERIS (MISCELLANEOUS) ×2 IMPLANT
CUP MEDICINE 2OZ PLAST GRAD ST (MISCELLANEOUS) ×3 IMPLANT
DRAPE C-ARM XRAY 36X54 (DRAPES) ×10 IMPLANT
DRAPE LAPAROTOMY 100X77 ABD (DRAPES) ×1 IMPLANT
DRAPE MICROSCOPE LEICA (MISCELLANEOUS) ×3 IMPLANT
DRAPE POUCH INSTRU U-SHP 10X18 (DRAPES) ×1 IMPLANT
DRAPE SURG 17X11 SM STRL (DRAPES) ×3 IMPLANT
DRAPE TABLE BACK 80X90 (DRAPES) ×3 IMPLANT
DRESSING TELFA 4X3 1S ST N-ADH (GAUZE/BANDAGES/DRESSINGS) ×2 IMPLANT
DRSG TEGADERM 4X4.75 (GAUZE/BANDAGES/DRESSINGS) IMPLANT
DURASEAL APPLICATOR TIP (TIP) IMPLANT
DURASEAL SPINE SEALANT 3ML (MISCELLANEOUS) IMPLANT
ELECT CAUTERY BLADE TIP 2.5 (TIP) ×3
ELECT EZSTD 165MM 6.5IN (MISCELLANEOUS) ×3
ELECT REM PT RETURN 9FT ADLT (ELECTROSURGICAL) ×3
ELECTRODE CAUTERY BLDE TIP 2.5 (TIP) ×1 IMPLANT
ELECTRODE EZSTD 165MM 6.5IN (MISCELLANEOUS) ×1 IMPLANT
ELECTRODE REM PT RTRN 9FT ADLT (ELECTROSURGICAL) ×1 IMPLANT
GLOVE BIO SURGEON STRL SZ8 (GLOVE) ×6 IMPLANT
GLOVE BIOGEL PI IND STRL 8 (GLOVE) ×1 IMPLANT
GLOVE BIOGEL PI INDICATOR 8 (GLOVE) ×2
GOWN STRL REUS W/ TWL LRG LVL3 (GOWN DISPOSABLE) ×1 IMPLANT
GOWN STRL REUS W/ TWL XL LVL3 (GOWN DISPOSABLE) ×1 IMPLANT
GOWN STRL REUS W/TWL LRG LVL3 (GOWN DISPOSABLE) ×3
GOWN STRL REUS W/TWL XL LVL3 (GOWN DISPOSABLE) ×3
GRADUATE 1200CC STRL 31836 (MISCELLANEOUS) ×1 IMPLANT
KIT RM TURNOVER STRD PROC AR (KITS) ×3 IMPLANT
KIT WILSON FRAME (KITS) ×3 IMPLANT
MARKER SKIN DUAL TIP RULER LAB (MISCELLANEOUS) ×5 IMPLANT
NDL SAFETY ECLIPSE 18X1.5 (NEEDLE) ×1 IMPLANT
NEEDLE HYPO 18GX1.5 SHARP (NEEDLE) ×3
NEEDLE HYPO 22GX1.5 SAFETY (NEEDLE) ×3 IMPLANT
NS IRRIG 1000ML POUR BTL (IV SOLUTION) ×3 IMPLANT
PACK LAMINECTOMY NEURO (CUSTOM PROCEDURE TRAY) ×3 IMPLANT
PACK LAP CHOLECYSTECTOMY (MISCELLANEOUS) ×2 IMPLANT
PAD ARMBOARD 7.5X6 YLW CONV (MISCELLANEOUS) ×3 IMPLANT
PATTIES SURGICAL .5 X.5 (GAUZE/BANDAGES/DRESSINGS) ×2 IMPLANT
SPOGE SURGIFLO 8M (HEMOSTASIS) ×4
SPONGE SURGIFLO 8M (HEMOSTASIS) IMPLANT
STAPLER SKIN PROX 35W (STAPLE) IMPLANT
STRIP CLOSURE SKIN 1/2X4 (GAUZE/BANDAGES/DRESSINGS) IMPLANT
SUT NURALON 4 0 TR CR/8 (SUTURE) IMPLANT
SUT VIC AB 0 CT1 18XCR BRD 8 (SUTURE) ×2 IMPLANT
SUT VIC AB 0 CT1 8-18 (SUTURE) ×6
SUT VIC AB 3-0 SH 8-18 (SUTURE) ×6 IMPLANT
SYR 20CC LL (SYRINGE) ×3 IMPLANT
SYR 30ML LL (SYRINGE) ×6 IMPLANT
SYRINGE 10CC LL (SYRINGE) ×6 IMPLANT
TOWEL OR 17X26 4PK STRL BLUE (TOWEL DISPOSABLE) ×2 IMPLANT
TRAY FOLEY W/METER SILVER 16FR (SET/KITS/TRAYS/PACK) IMPLANT
TUBING CONNECTING 10 (TUBING) ×1 IMPLANT
TUBING CONNECTING 10' (TUBING)

## 2016-04-09 NOTE — Anesthesia Preprocedure Evaluation (Signed)
Anesthesia Evaluation  Patient identified by MRN, date of birth, ID band Patient awake    Reviewed: Allergy & Precautions, NPO status , Patient's Chart, lab work & pertinent test results  History of Anesthesia Complications Negative for: history of anesthetic complications  Airway Mallampati: III       Dental  (+) Upper Dentures   Pulmonary neg pulmonary ROS, former smoker,           Cardiovascular negative cardio ROS       Neuro/Psych    GI/Hepatic negative GI ROS, Neg liver ROS,   Endo/Other  negative endocrine ROS  Renal/GU negative Renal ROS     Musculoskeletal   Abdominal   Peds  Hematology   Anesthesia Other Findings   Reproductive/Obstetrics                             Anesthesia Physical Anesthesia Plan  ASA: II  Anesthesia Plan: General   Post-op Pain Management:    Induction: Intravenous  Airway Management Planned: Oral ETT  Additional Equipment:   Intra-op Plan:   Post-operative Plan:   Informed Consent: I have reviewed the patients History and Physical, chart, labs and discussed the procedure including the risks, benefits and alternatives for the proposed anesthesia with the patient or authorized representative who has indicated his/her understanding and acceptance.     Plan Discussed with:   Anesthesia Plan Comments:         Anesthesia Quick Evaluation

## 2016-04-09 NOTE — Progress Notes (Signed)
Pharmacy Antibiotic Note  Anthony Ayala is a 40 y.o. male admitted on 04/09/2016 with Lumbar radiculopathy. Pharmacy consulted for Vancomycin prophylaxis post-op  For patient with PCN allergy. Patient had pre-op dose of Vancomycin at 1000 on 12/6. Vancomycin should be ordered for 1 dose 12 hours post-op unless patient has a drain, then continue vancomycin until discontinued by physician.   Plan: Spoke with nurse on 12/6 at 1645, patient does not have drain and will only require 1 post-op prophylaxis dose of vancomycin.   Ke: 0.121  T1/2   VD: 57  CrCL:12540ml/min   DW: 81 KG   Will give the patient Vancomycin 1500mg  IV x 1 dose 12 hours after initial pro-op dose of vancomycin.   Height: 5\' 8"  (172.7 cm) Weight: 220 lb (99.8 kg) IBW/kg (Calculated) : 68.4  Temp (24hrs), Avg:97.2 F (36.2 C), Min:96.8 F (36 C), Max:97.9 F (36.6 C)  No results for input(s): WBC, CREATININE, LATICACIDVEN, VANCOTROUGH, VANCOPEAK, VANCORANDOM, GENTTROUGH, GENTPEAK, GENTRANDOM, TOBRATROUGH, TOBRAPEAK, TOBRARND, AMIKACINPEAK, AMIKACINTROU, AMIKACIN in the last 168 hours.  Estimated Creatinine Clearance: 140.6 mL/min (by C-G formula based on SCr of 0.7 mg/dL).    Allergies  Allergen Reactions  . Ivp Dye [Iodinated Diagnostic Agents] Anaphylaxis  . Penicillins Hives    Has patient had a PCN reaction causing immediate rash, facial/tongue/throat swelling, SOB or lightheadedness with hypotension: unknown Has patient had a PCN reaction causing severe rash involving mucus membranes or skin necrosis:unknow Has patient had a PCN reaction that required hospitalization No Has patient had a PCN reaction occurring within the last 10 years: No If all of the above answers are "NO", then may proceed with Cephalosporin use.      Antimicrobials this admission: 12/6 vancomycin x 1 dose    Thank you for allowing pharmacy to be a part of this patient's care.  Gardner CandleSheema M Deshanta Lady, PharmD, BCPS Clinical  Pharmacist 04/09/2016 4:56 PM

## 2016-04-09 NOTE — Transfer of Care (Signed)
Immediate Anesthesia Transfer of Care Note  Patient: Anthony Ayala  Procedure(s) Performed: Procedure(s): LUMBAR LAMINECTOMY/DECOMPRESSION MICRODISCECTOMY 2 LEVELS (N/A)  Patient Location: PACU  Anesthesia Type:General  Level of Consciousness: awake, alert  and oriented  Airway & Oxygen Therapy: Patient Spontanous Breathing and Patient connected to face mask oxygen  Post-op Assessment: Report given to RN and Post -op Vital signs reviewed and stable  Post vital signs: Reviewed and stable  Last Vitals:  Vitals:   04/09/16 0847 04/09/16 1458  BP: (!) 137/92 (!) 144/95  Pulse: 82 84  Resp: 16 16  Temp: 36.1 C (!) 36 C    Last Pain:  Vitals:   04/09/16 1458  TempSrc: Tympanic         Complications: No apparent anesthesia complications

## 2016-04-09 NOTE — Op Note (Signed)
INDICATIONS:  Patient is a 40 year old male with long-standing back pain and lower extremity radiculopathy on the left. Patient failed multiple methods of conservative therapy including physical therapy and epidural steroid injections. MRI showed a left eccentric L3-L4 herniated disc as well as a left L4-L5 herniated disc. I spoke to the patient at length multiple times in clinic about the possibility of surgical intervention. I explained to him the risks which include, but are not limited to infection bleeding need for further surgery CSF leak and neurological injury. He wished to pursue surgery.  PROCEDURE: I met the patient in the preoperative area and identified the patient and confirmed the planned procedure.  I marked his left side of his back to indicate it was a left-sided microdiscectomy.  Patient was rolled into the operating room where general anesthesia was administered and an endotracheal tube was placed. A Foley catheter was also placed and adequate IV access was assured. Patient was flipped onto a Wilson frame. All bony prominences were adequately padded. A C-arm was brought into place to correctly identify the levels of surgery. The patient was prepped and draped in a sterile fashion. A surgical timeout was done before surgery to confirm the patient laterality and the procedure. Preoperative antibiotics were given.  Local anesthesia was infiltrated into the skin. A 10 blade was used to make a single incision. This was deepened using Bovie cautery. The fascia was identified and the Bovie was used to make an incision to the left of the spinous process. The Depuy spotlight retractor was used with sequential dilation on the inferior lamina of the left L4. Soft tissue was then uncovered using Bovie cautery and pituitary rongeurs. A drill was used to drill off the inferior lamina of L4. Attention was made not to take more than half the facet on that side. The ligamentum flavum was entered using an  upgoing curet and I made sure that we were lateral to the theca at this point. The disc was identified and a 15 blade was used to make an incision in the disc. The discectomy was done using a combination of upgoing and downgoing curettes and the pituitary. I made sure that there is adequate decompression. With the discectomy was complete I irrigated the disc space with bacitracin irrigation.  Attention was then placed to doing the L3-L4 microdiscectomy. Again the diffuse spotlight was brought in and the inferior lamina of the L3 vertebra was found. I drilled off the inferior lamina of the left L3 vertebra and the ligamentum was identified. At this time my visualization was not adequate and I decided to place a McCullough retractor and take out the spotlight retractor. After the ligamentum flavum was entered the theca was identified and retracted this medially I was able to find the disc. It should be noted that the disc bulge at this level was larger than the one at the L4-L5 and the traversing nerve root was under considerable stress as the patient's leg was moving one Weaver did the discectomy at this level. Again 15 blade was used to make an annulotomy and the discectomy was complete. At this time the left L4 nerve root was visibly more relaxed. I placed a Gelfoam with Depo-Medrol on the nerve and irrigated again with this irrigation.  Attention was then placed on closure. 0 Vicryls were used to close the fascia. Another layer of 06 was used.3-0 Vicryls were used in inverted fashion to close close the subcuticular tissue. A sterile dressing was placed. Patient was then  rolled back onto the regular or table and extubated with no difficulties. He was wiggling his toes before we rolled out to the PACU.  EBL: 675 cc  COMPLICATIONS:  NONE  I performed the entire procedure

## 2016-04-09 NOTE — Brief Op Note (Signed)
04/09/2016  3:03 PM  PATIENT:  Buel ReamScott Lovett  40 y.o. male  PRE-OPERATIVE DIAGNOSIS:  LUMBAR RADICULOPATHY  POST-OPERATIVE DIAGNOSIS:  lumbar radiculopathy  PROCEDURE:  Procedure(s): LUMBAR LAMINECTOMY/DECOMPRESSION MICRODISCECTOMY 2 LEVELS (N/A)  SURGEON:  Surgeon(s) and Role:    * Karenann CaiMuhammad Abd-El-Barr, MD - Primary  PHYSICIAN ASSISTANT: none  ASSISTANTS: none   ANESTHESIA:   general  EBL:  Total I/O In: 1300 [I.V.:1300] Out: 350 [Urine:200; Blood:150]  BLOOD ADMINISTERED:none  DRAINS: none   LOCAL MEDICATIONS USED:  BUPIVICAINE   SPECIMEN:  No Specimen  DISPOSITION OF SPECIMEN:  N/A  COUNTS:  YES  TOURNIQUET:  * No tourniquets in log *  DICTATION: .Dragon Dictation  PLAN OF CARE: Admit for overnight observation  PATIENT DISPOSITION:  PACU - hemodynamically stable.   Delay start of Pharmacological VTE agent (>24hrs) due to surgical blood loss or risk of bleeding: yes

## 2016-04-09 NOTE — Progress Notes (Signed)
On admission patient alert and oriented. Patient not complaining of any pain except for the discomfort of his urinary catheter. RN removed at 5:08pm patient has urinal at bedside. Patient heart sounds normal and lung sounds clear/diminished. Patient's dressing is clean dry and intact with minimal serosanguinous drainage. Patient also complaining of left eye irritation. Patient having some drainage from eye. SCD's on. Patient oriented to room and call bell.   Harvie HeckMelanie Tamikia Chowning, RN

## 2016-04-09 NOTE — Interval H&P Note (Signed)
History and Physical Interval Note:  04/09/2016 9:27 AM  Anthony Ayala  has presented today for surgery, with the diagnosis of LUMBAR RADICULOPATHY  The various methods of treatment have been discussed with the patient and family. After consideration of risks, benefits and other options for treatment, the patient has consented to  Procedure(s): LUMBAR LAMINECTOMY/DECOMPRESSION MICRODISCECTOMY 2 LEVELS (N/A) as a surgical intervention .  The patient's history has been reviewed, patient examined, no change in status, stable for surgery.  I have reviewed the patient's chart and labs.  Questions were answered to the patient's satisfaction.     Patient with continued left lower extremity radiculopathy.  Plan is for left L3-4, L4-5 microdiscectomy   Anthony Ayala

## 2016-04-09 NOTE — H&P (Signed)
History of Present Illness: Anthony Ayala is a 40 y.o. male who presents with the chief complaint of back pain for 2 weeks after lifting something heavy at work.  Immediately afterward, he felt excruciating back pain, to 10/10.  No radicular symptoms.  He says that his pain has improved slightly with time.  Flexeril helps and his medrol dose pack helped as well.  No bowel or bladder problems.    Anthony Ayala has no symptoms of cervical myelopathy.  The symptoms are causing a significant impact on the patient's life.   Review of Systems:  A 10 point review of systems is negative, except for the pertinent positives and negatives detailed in the HPI.  Past Medical History: History reviewed. No pertinent past medical history.  Past Surgical History:      Past Surgical History:  Procedure Laterality Date  . cyst removal neck    . foot surgery    . knee surgery Bilateral     Allergies:      Allergies as of 01/17/2016 Loraine Leriche- Mark as Reviewed 01/17/2016  Allergen Reaction Noted  . Penicillins Hives 01/09/2016    Medications: Encounter Medications        Outpatient Encounter Prescriptions as of 01/17/2016  Medication Sig Dispense Refill  . cyclobenzaprine (FLEXERIL) 10 MG tablet Take 10 mg by mouth nightly as needed for Muscle spasms.    Marland Kitchen. ibuprofen (ADVIL,MOTRIN) 800 MG tablet Take by mouth.    . [DISCONTINUED] predniSONE (DELTASONE) 10 MG tablet Take by mouth.    . [DISCONTINUED] tiZANidine (ZANAFLEX) 4 MG capsule Take by mouth.     No facility-administered encounter medications on file as of 01/17/2016.       Social History:       Social History   Substance Use Topics   . Smoking status: Current Some Day Smoker   . Smokeless tobacco: Never Used   . Alcohol use 2.4 oz/week    4 Cans of beer per week    Family Medical History: History reviewed. No pertinent family history.  Physical Examination:    Vitals:   01/17/16 0936   BP: (!) 133/91  Pulse: 84  Temp: 37.4 C (99.3 F)  TempSrc: Oral  Weight: (!) 101.2 kg (223 lb)  Height: 172.7 cm (5\' 8" )  PainSc:   4  PainLoc: Back    General:             Patient is well developed, well nourished, calm, collected, and in no apparent distress.  Psychiatric:        Patient is non-anxious.  Head:                 Pupils equal, round, and reactive to light.  ENT:                  Oral mucosa appears well hydrated.  Neck:                 Supple.  Full range of motion.  Respiratory:       Patient is breathing without any difficulty.  Extremities:        No edema.  Vascular:           Palpable pulses.  Skin:                  On exposed skin, there are no abnormal skin lesions.  NEUROLOGICAL:  General: In no acute distress.   Awake, alert, oriented to person, place, and time.  Pupils equal round and reactive to light.  Facial tone is symmetric.  Tongue protrusion is midline.  There is no pronator drift.    Strength: Side Biceps Triceps Deltoid Interossei Grip Wrist Ext. Wrist Flex.  R 5 5 5 5 5 5 5   L 5 5 5 5 5 5 5    Side Iliopsoas Quads Hamstring PF DF EHL  R 5 5 5 5 5 5   L 5 5 5 5 5 5    Reflexes are 2+ and symmetric at the biceps, triceps, brachioradialis, patella and achilles.   Bilateral upper and lower extremity sensation is intact to light touch and pin prick.  Clonus is not present.  Toes are down-going.  Gait is normal.  No difficulty with tandem gait.  Hoffman's is absent.  Imaging: CT lumbar spine 01/12/2016 shows normal alignment.  There is some evidence of disc height loss at L3-4 with some evidence of herniated disc.  I have personally reviewed the images and agree with the above interpretation.  Assessment and Plan: Anthony Ayala is a pleasant 40 y.o. male with back pain after heavy lifting.  His CT is not remarkable.  I have discussed the condition with the patient, including showing the radiographs and discussing  treatment options in layman's terms.  The patient may benefit from conservative management.  Thus, I have recommended the following: physical therapy. If this does not help in 4 weeks, we could think of doing an MRI of his lumbar spine, but at this point, with his pain getting better, he will likely not need any further neuorsurgical management.  Thank you for involving me in the care of this patient. I will keep you apprised of the patient's progress   Anthony Ayala returns to the office today with continued back pain and now left-sided radicular pain and numbness that goes down his posterior lateral thigh into his calf.  This pain has become so debilitating that he is no longer able to do most of his everyday activities.  He has tried physical therapy as well as epidural steroid injections and those have not helped.  He complains of new numbness in his left lower extremity.  MRI of his lumbar spine done on January 21, 2016 shows good lumbar lordosis.  There is evidence of herniated disks at L3-L4 and L4-L5 that are left eccentric.  Given that Anthony Ayala has failed physical therapy and his MRI shows evidence of compression of the exiting left L4 and L5 nerve roots, which are in the distribution of his pain and numbness, I think he would be a good candidate for a left L3-L4 hemilaminectomy and discectomy and a left L4-L5 hemilaminectomy and discectomy.  I explained the surgery to him in layman's terms and counseled him on the possibilities at this time, including watching and surgical intervention.  I counseled him of the risks of surgery, which include but are not restricted to infection, bleeding, need for further surgery, CSF leak and neurological injury.  We mutually agreed that surgery would most likely be of benefit for him and he would like to schedule this for April 09, 2016.  I think this is very reasonable given the fact that he has failed conservative therapy and his imaging and  symptomology are consistent.  Thank you for the opportunity to care of this patient.

## 2016-04-09 NOTE — Progress Notes (Signed)
DBP 104  Dr piscitello called  No new orders

## 2016-04-09 NOTE — Anesthesia Procedure Notes (Signed)
Procedure Name: Intubation Date/Time: 04/09/2016 9:57 AM Performed by: Omer JackWEATHERLY, Jett Fukuda Pre-anesthesia Checklist: Patient identified, Patient being monitored, Timeout performed, Emergency Drugs available and Suction available Patient Re-evaluated:Patient Re-evaluated prior to inductionOxygen Delivery Method: Circle system utilized Preoxygenation: Pre-oxygenation with 100% oxygen Intubation Type: IV induction Ventilation: Two handed mask ventilation required and Mask ventilation without difficulty Laryngoscope Size: Mac and 3 Grade View: Grade II Tube type: Oral Tube size: 7.5 mm Number of attempts: 1 Airway Equipment and Method: Stylet Placement Confirmation: ETT inserted through vocal cords under direct vision,  positive ETCO2 and breath sounds checked- equal and bilateral Secured at: 21 cm Tube secured with: Tape Dental Injury: Teeth and Oropharynx as per pre-operative assessment

## 2016-04-10 ENCOUNTER — Encounter: Payer: Self-pay | Admitting: Neurological Surgery

## 2016-04-10 DIAGNOSIS — M5116 Intervertebral disc disorders with radiculopathy, lumbar region: Secondary | ICD-10-CM | POA: Diagnosis not present

## 2016-04-10 MED ORDER — DIAZEPAM 5 MG PO TABS: 5.0000 mg | ORAL_TABLET | Freq: Four times a day (QID) | ORAL | 0 refills | Status: DC | PRN

## 2016-04-10 MED ORDER — HYDROCODONE-ACETAMINOPHEN 5-325 MG PO TABS: 1.0000 | ORAL_TABLET | ORAL | 0 refills | Status: DC | PRN

## 2016-04-10 NOTE — Evaluation (Signed)
Physical Therapy Evaluation Patient Details Name: Anthony Ayala MRN: [id number] DOB: Dec 30, 1975 Today's Date: 04/10/2016   History of Present Illness  Pt is a 40 y/o M s/p L L3-4, L4-5 microdiscectomies.  Per pt, and as scar suggests, his PMH includes repair of R cleft foot.      Clinical Impression  Pt admitted with above diagnosis. Anthony Ayala is at modI level of mobility due to back precautions, pt already aware of back precautions prior to PT session.  He is independent with all aspects of mobility.  Pt educated on the importance of ambulating every 45 minutes to prevent stiffness at surgical site.  No skilled PT needs identified.  PT will sign off.   Follow Up Recommendations No PT follow up    Equipment Recommendations  None recommended by PT    Recommendations for Other Services       Precautions / Restrictions Precautions Precautions: Back Precaution Booklet Issued: No Precaution Comments: No specific orders for back precautions but pt already aware of no bending, lifting, twisting and pt has been told by MD no lifting greater than a gallon of milk Restrictions Weight Bearing Restrictions: No      Mobility  Bed Mobility Overal bed mobility: Independent             General bed mobility comments: No cues or physical assist needed, pt with proper mechanics to protect back.  Transfers Overall transfer level: Independent Equipment used: None             General transfer comment: No cues or physical assist needed. Pt reports stiffness as he has been lying supine in bed since surgery.  Ambulation/Gait Ambulation/Gait assistance: Independent Ambulation Distance (Feet): 250 Feet Assistive device: None Gait Pattern/deviations: Decreased dorsiflexion - right (dec trunk rotation)   Gait velocity interpretation: at or above normal speed for age/gender General Gait Details: Dec R DF and dec trunk rotation Bil.  Pt steady and independent  Stairs             Wheelchair Mobility    Modified Rankin (Stroke Patients Only)       Balance Overall balance assessment: Independent (denies any falls in the past 6 months)                                           Pertinent Vitals/Pain Pain Assessment: Faces Faces Pain Scale: Hurts a little bit Pain Location: stiffness LLE and back Pain Descriptors / Indicators:  ("stiffness") Pain Intervention(s): Limited activity within patient's tolerance;Monitored during session    Home Living Family/patient expects to be discharged to:: Private residence Living Arrangements: Spouse/significant other;Children Available Help at Discharge: Family;Available 24 hours/day Type of Home: House Home Access: Stairs to enter Entrance Stairs-Rails: LawyerLeft;Right Entrance Stairs-Number of Steps: 3 Home Layout: One level Home Equipment: None      Prior Function Level of Independence: Independent         Comments: Unemployed but enjoys working on cars and is currently working on an '89 [employer]ang     Hand Dominance   Dominant Hand: Right    Extremity/Trunk Assessment   Upper Extremity Assessment: Overall WFL for tasks assessed           Lower Extremity Assessment: RLE deficits/detail;LLE deficits/detail RLE Deficits / Details: Scar where pt reports h/o repair of cleft foot.  Limited DF to ~5 deg.  RLE strength WFL. LLE  Deficits / Details: Strength WFL  Cervical / Trunk Assessment: Other exceptions  Communication   Communication: No difficulties  Cognition Arousal/Alertness: Awake/alert Behavior During Therapy: WFL for tasks assessed/performed Overall Cognitive Status: Within Functional Limits for tasks assessed                      General Comments      Exercises Other Exercises Other Exercises: gentle L calf stretch (runner's stretch) as pt reports stiffness in this region.  Pt reports relief following.  1x20 seconds. Other Exercises: Pt encouraged to ambulate  every 45 minutes to avoid stiffness.   Assessment/Plan    PT Assessment Patent does not need any further PT services  PT Problem List            PT Treatment Interventions      PT Goals (Current goals can be found in the Care Plan section)  Acute Rehab PT Goals Patient Stated Goal: to go home PT Goal Formulation: All assessment and education complete, DC therapy    Frequency     Barriers to discharge        Co-evaluation               End of Session   Activity Tolerance: Patient tolerated treatment well Patient left: Other (comment);with family/visitor present (pt ambulating to bathroom) Nurse Communication: Mobility status    Functional Assessment Tool Used: Clinical Judgement Functional Limitation: Mobility: Walking and moving around Mobility: Walking and Moving Around Current Status ([id number](G8978): At least 1 percent but less than 20 percent impaired, limited or restricted Mobility: Walking and Moving Around Goal Status [phone](G8979): At least 1 percent but less than 20 percent impaired, limited or restricted Mobility: Walking and Moving Around Discharge Status [phone](G8980): At least 1 percent but less than 20 percent impaired, limited or restricted (mod I due to back precautions)    Time: [id number] PT Time Calculation (min) (ACUTE ONLY): 15 min   Charges:   PT Evaluation $PT Eval Low Complexity: 1 Procedure     PT G Codes:   PT G-Codes **NOT FOR INPATIENT CLASS** Functional Assessment Tool Used: Clinical Judgement Functional Limitation: Mobility: Walking and moving around Mobility: Walking and Moving Around Current Status ([id number](G8978): At least 1 percent but less than 20 percent impaired, limited or restricted Mobility: Walking and Moving Around Goal Status [phone]([id number]): At least 1 percent but less than 20 percent impaired, limited or restricted Mobility: Walking and Moving Around Discharge Status [phone](G8980): At least 1 percent but less than 20 percent impaired, limited or restricted  (mod I due to back precautions)    Encarnacion ChuAshley Kailana Benninger PT, DPT 04/10/2016, 10:10 AM

## 2016-04-10 NOTE — Progress Notes (Signed)
Clinical Child psychotherapistocial Worker (CSW) received SNF consult. Per PT no needs. Please reconsult if future social work needs arise. CSW signing off.   Baker Hughes IncorporatedBailey Flois [place]ct[place], LCSW [phone]

## 2016-04-10 NOTE — Progress Notes (Signed)
OT Cancellation Note  Patient Details Name: Buel ReamScott Catino MRN: [id number] DOB: 10-21-1975   Cancelled Treatment:    Reason Eval/Treat Not Completed: OT screened, no needs identified, will sign off  Olegario MessierElaine Sidni Fusco, MS, OTR/L 04/10/2016, 11:22 AM

## 2016-04-10 NOTE — Discharge Summary (Signed)
Patient is POD 1 from left L3-4, L4-5 microdiscectomies.  Patient doing well.  Left lower extremity pain gone.  Able to urinate.    5/5 in all lower extremities.  Dressing:  C/d/i.  DC today to home.

## 2016-04-10 NOTE — Discharge Instructions (Signed)
Your surgeon has performed an operation on your lumbar spine (low back) to relieve pressure on one or more nerves. Many times, patients feel better immediately after surgery and can overdo it. Even if you feel well, it is important that you follow these activity guidelines. If you do not let your back heal properly from the surgery, you can increase the chance of a disc herniation and return of your symptoms. The following are instructions to help in your recovery once you have been discharged from the hospital.  * Do not take anti-inflammatory medications for 7 days after surgery (naproxen [Aleve], ibuprofen [Advil, Motrin], celecoxib [Celebrex], etc.)  Activity    No bending, lifting, or twisting (BLT). Avoid lifting objects heavier than 10 pounds (gallon milk jug).  Where possible, avoid household activities that involve lifting, bending, pushing, or pulling such as laundry, vacuuming, grocery shopping, and childcare. Try to arrange for help from friends and family for these activities while your back heals.  Increase physical activity slowly as tolerated.  Taking short walks is encouraged, but avoid strenuous exercise. Do not jog, run, bicycle, lift weights, or participate in any other exercises unless specifically allowed by your doctor. Avoid prolonged sitting, including car rides.  Talk to your doctor before resuming sexual activity.  You should not drive until cleared by your doctor.  Until released by your doctor, you should not return to work or school.  You should rest at home and let your body heal.   You may shower three days after your surgery.  After showering, lightly dab your incision dry. Do not take a tub bath or go swimming until approved by your doctor at your follow-up appointment.  If you smoke, we strongly recommend that you quit.  Smoking has been proven to interfere with normal healing in your back and will dramatically reduce the success rate of your surgery. Please  contact [facility] (800-QUIT-NOW) and use the resources at www.[facility].com for assistance in stopping smoking.  Surgical Incision   If you have a dressing on your incision, you may remove it two days after your surgery. Keep your incision area clean and dry.  If you have staples or stitches on your incision, you should have a follow up scheduled for removal. If you do not have staples or stitches, you will have steri-strips (small pieces of surgical tape) or Dermabond glue. The steri-strips/glue should begin to peel away within about a week (it is fine if the steri-strips fall off before then). If the strips are still in place one week after your surgery, you may gently remove them.  Diet            You may return to your usual diet. Be sure to stay hydrated.  When to Contact [place]  Although your surgery and recovery will likely be uneventful, you may have some residual numbness, aches, and pains in your back and/or legs. This is normal and should improve in the next few weeks.  However, should you experience any of the following, contact us immediately:  New numbness or weakness  Pain that is progressively getting worse, and is not relieved by your pain medications or rest  Bleeding, redness, swelling, pain, or drainage from surgical incision  Chills or flu-like symptoms  Fever greater than 101.0 F (38.3 C)  Problems with bowel or bladder functions  Difficulty breathing or shortness of breath  Warmth, tenderness, or swelling in your calf   Contact Information  After hours and weekends, please call the  [facility] at [phone] and ask for the Neurosurgery Resident On Call   For a life-threatening emergency, call 911

## 2016-04-14 NOTE — Anesthesia Postprocedure Evaluation (Signed)
Anesthesia Post Note  Patient: Anthony Ayala  Procedure(s) Performed: Procedure(s) (LRB): left L3-4. L4-5 microdisectomy (N/A)  Patient location during evaluation: PACU Anesthesia Type: General Level of consciousness: awake Pain management: pain level controlled Respiratory status: spontaneous breathing Cardiovascular status: stable Anesthetic complications: no    Last Vitals:  Vitals:   04/10/16 0438 04/10/16 0843  BP: 115/71 124/81  Pulse: 82 74  Resp: 20   Temp: 36.8 C     Last Pain:  Vitals:   04/10/16 0900  TempSrc:   PainSc: 7                  VAN STAVEREN,Caralynn Gelber

## 2016-05-20 ENCOUNTER — Other Ambulatory Visit: Payer: Self-pay | Admitting: Neurological Surgery

## 2016-05-20 DIAGNOSIS — M5417 Radiculopathy, lumbosacral region: Secondary | ICD-10-CM

## 2016-05-21 ENCOUNTER — Ambulatory Visit
Admission: RE | Admit: 2016-05-21 | Discharge: 2016-05-21 | Disposition: A | Discharge status: Home / Self Care | Payer: Medicaid Other | Source: Ambulatory Visit | Attending: Neurological Surgery | Admitting: Neurological Surgery

## 2016-05-21 DIAGNOSIS — M5126 Other intervertebral disc displacement, lumbar region: Secondary | ICD-10-CM | POA: Diagnosis not present

## 2016-05-21 DIAGNOSIS — M5417 Radiculopathy, lumbosacral region: Secondary | ICD-10-CM

## 2016-05-21 DIAGNOSIS — M48061 Spinal stenosis, lumbar region without neurogenic claudication: Secondary | ICD-10-CM | POA: Insufficient documentation

## 2016-05-21 MED ORDER — GADOBENATE DIMEGLUMINE 529 MG/ML IV SOLN
20.0000 mL | Freq: Once | INTRAVENOUS | Status: AC | PRN
  Administered 2016-05-21: 20 mL via INTRAVENOUS

## 2016-09-17 DIAGNOSIS — M76892 Other specified enthesopathies of left lower limb, excluding foot: Secondary | ICD-10-CM | POA: Insufficient documentation

## 2017-04-14 ENCOUNTER — Ambulatory Visit: Payer: Medicaid Other | Attending: Orthopedic Surgery | Admitting: Physical Therapy

## 2017-04-14 DIAGNOSIS — M25552 Pain in left hip: Secondary | ICD-10-CM | POA: Diagnosis present

## 2017-04-14 DIAGNOSIS — R262 Difficulty in walking, not elsewhere classified: Secondary | ICD-10-CM | POA: Insufficient documentation

## 2017-04-14 NOTE — Therapy (Signed)
[place] [facility] [address], [place], [address] Phone: [phone]   Fax:  [phone]  Physical Therapy Evaluation  Patient Details  Name: Anthony Ayala MRN: [id number] Date of Birth: Dec 08, 1975 Referring Provider: Dr. [place] Mather   Encounter Date: 04/14/2017  PT End of Session - 04/14/17 1152    Visit Number  1    Number of Visits  17    Date for PT Re-Evaluation  07/07/17    PT Start Time  1105    PT Stop Time  1140    PT Time Calculation (min)  35 min    Activity Tolerance  Patient tolerated treatment well    Behavior During Therapy  [facility] for tasks assessed/performed       Past Medical History:  Diagnosis Date  . Arthritis    right ankle  . Chronic back pain   . History of kidney stones     Past Surgical History:  Procedure Laterality Date  . CYST REMOVAL NECK    . FOOT SURGERY    . KNEE SURGERY    . LUMBAR LAMINECTOMY/DECOMPRESSION MICRODISCECTOMY N/A 04/09/2016   Procedure: left L3-4. L4-5 microdisectomy;  Surgeon: Karenann Cai, MD;  Location: [facility] ORS;  Service: Neurosurgery;  Laterality: N/A;  . SHOULDER ARTHROSCOPY Right 2012    There were no vitals filed for this visit.   Subjective Assessment - 04/14/17 1110    Subjective  Patient reports last September 2017 he injured his back and L hip while at work. He had multi-level discectomy in December of 2017 (L3-L4, L4-L5). He reports this helped with his back pain, but that he continued to have L hip pain. He had an injection in the hip, but ultimately underwent L hip labral repair on 04/08/2017. Since that time he has opted to not use the crutches at times and is 30 minutes late to this session as he had been helping his neighbor. Reports he got a bruise from the brace and has not been using it at times.     Limitations  Lifting;Standing;Walking;House hold activities    Diagnostic tests  CT/X-ray/MRI of his lumbar spine indicating  herniated discs, resolution post-operatively.     Patient Stated Goals  To be able to work and exercise comfortably again     Currently in Pain?  Yes    Pain Score  -- Very minimal pain/tenderness around L anterior thigh/hip    Pain Location  Hip    Pain Orientation  Anterior;Left    Pain Descriptors / Indicators  Dull;Aching    Pain Type  Chronic pain;Surgical pain    Pain Onset  More than a month ago    Pain Frequency  Intermittent    Aggravating Factors   Increased walking, weightbearing activities     Pain Relieving Factors  Knee extended in sitting, CPM, rest          [facility] PT Assessment - 04/14/17 1438      Assessment   Medical Diagnosis  L Hip arthroscopic labral repair    Referring Provider  Dr. [place] Mather    Onset Date/Surgical Date  04/08/17    Prior Therapy  Previous bouts of PT post-op discectomy      Precautions   Precautions  Other (comment) Labral repair protocol    Precaution Comments  Flexion to 90, extension/ER prone/IR at 90 - 0 degrees, abduction to 30 degrees, IR prone to comfort, ER at 90 degrees -20 degrees  Required Braces or Orthoses  Other Brace/Splint Hip arthroscopic brace      Restrictions   Weight Bearing Restrictions  Yes    LLE Weight Bearing  Partial weight bearing    LLE Partial Weight Bearing Percentage or Pounds  50    Other Position/Activity Restrictions  Foot flat      Balance Screen   Has the patient fallen in the past 6 months  No    Has the patient had a decrease in activity level because of a fear of falling?   Yes    Is the patient reluctant to leave their home because of a fear of falling?   Yes      Home Environment   Living Environment  Private residence    Living Arrangements  Non-relatives/Friends    Available Help at Discharge  Friend(s)    Type of Home  House      Prior Function   Level of Independence  Independent    Vocation  Workers comp    Leisure  Patient used to be a Special educational needs teacher Status  Within Functional Limits for tasks assessed      Sensation   Light Touch  Appears Intact      **Discussed need for offloading of joint to allow for proper healing of the joint, even in the absence of pain. This includes use of crutches and 50% weightbearing along with brace on at all times.  Patient presented without crutches and brace on. His gait appeared antalgic though he denied significant pain. He maintained his LLE in flexion (at knee and hip) secondary to weakness.   Educated patient on techniques to get in and out of bed without use of hip flexion and informed patient that he was to avoid all active hip flexion and limit step lengths.   Educated patient on HEP including: Glute sets x 5 repetitions x 10" holds  Quad sets x 5 repetitions x 10" holds  TA isometric contractions with knees flexed in supine x 5 repetitions x 10" holds  Supine IR passive log rolling x 5 repetitions   Palpation around incision indicated very mild tenderness over lateral bony prominence of greater trochanter, patient was in [place] and unable to visualize wound, he reports he has been checking and has not had any indications of infection, discussed indications of infection and to report to ER immediately if he notices fevers, sweats, shortness of breath, etc.        Objective measurements completed on examination: See above findings.              PT Education - 04/14/17 1148    Education provided  Yes    Education Details  Extensively educated patient on need to use crutches and brace at all times to allow time for hip to heal post-operatively. Encouarged 50% weightbearing for the next 3 weeks.     Person(s) Educated  Patient    Methods  Explanation;Demonstration;Handout    Comprehension  Tactile cues required;Returned demonstration;Verbalized understanding;Need further instruction;Verbal cues required       PT Short Term Goals - 04/14/17 1458      PT SHORT  TERM GOAL #1   Title  Patient will comply with post-operative protocol including: 50% weightbearing with foot flat, use of crutches at all times, and hip brace on at all times.     Baseline  Non-compliant at time of evaluation.     Time  1    Period  Days    Status  New    Target Date  04/15/17        PT Long Term Goals - 04/14/17 1447      PT LONG TERM GOAL #1   Title  Patient will report an LEFS score of greater than 50/80 to demonstrate improved tolerance for ADLs.     Baseline  Did not complete     Time  12    Period  Weeks    Status  New    Target Date  07/07/17      PT LONG TERM GOAL #2   Title  Patient will report worst pain of no more than 1/10 to demonstrate improved tolerance for ADLs.     Baseline  Reports only mild pain at this point.     Time  12    Period  Weeks    Status  New    Target Date  07/07/17      PT LONG TERM GOAL #3   Title  Patient will perform a squat with appropriate mechanics and no increase in pain to demonstrate improved tolerance for functional activities.     Baseline  Contra-indicated at eval     Time  12    Period  Weeks    Status  New    Target Date  07/07/17      PT LONG TERM GOAL #4   Title  Patient will ascend/descend at least 4 steps reciprocally without use of UEs to demonstrate improved LE strength in functional activities.     Baseline  Precautions necessitate no reciprocal WBing.     Time  12    Period  Weeks    Status  New    Target Date  07/07/17      PT LONG TERM GOAL #5   Title  Patient will lift at least 20# off the floor to return to work related activities with no increase in pain.     Baseline  Contra-indicated     Time  12    Period  Weeks    Status  New    Target Date  07/07/17             Plan - 04/14/17 1155    Clinical Impression Statement  Patient is a pleasant 41 y/o male that presents s/p L hip labral repair on 04/08/2017. He reports significant pain relief since the surgery, however he has been  ambulating without crutches, and presents this date without brace. Theapist spent a considerable amount of time educating patient that this surgery has 3 phases and rehabilitation, this first one lasting 4 weeks and necessitating offloading the joint to allow for healing even in the absence of pain. Strongly encouraged patient to avoid all active hip flexion, use the brace at all times, and perform 50% weightbearing on his LLE with foot flat and using crutches. He has been using CPM, and reports he has been checking his wound without any signs of infection. Minimal tenderness over lateral thigh, and no other signs of infection noted. He was able to perform HEP appropriately this date, however given his non-compliance thus far deferred gait training or observation as he did not have crutches or brace on. He will likely benefit from skilled PT services to emphasize precautionary measures and oversee strengthening program to minimize post-operative discomfort and maximize function.     Clinical Presentation  Stable    Clinical Decision Making  Moderate  Rehab Potential  Good    Clinical Impairments Affecting Rehab Potential  Young, minimal post-operative pain, motivated     PT Frequency  2x / week    PT Duration  8 weeks    PT Treatment/Interventions  Aquatic Therapy;Moist Heat;Iontophoresis 4mg /ml Dexamethasone;Gait training;DME Instruction;Balance training;Neuromuscular re-education;Passive range of motion;Dry needling;Manual techniques;Ultrasound;Cryotherapy;Electrical Stimulation;Therapeutic exercise;Therapeutic activities;Patient/family education;Taping    PT Next Visit Plan  Emphasize post-operative precautions, progress HEP as indicated in protocol.     PT Home Exercise Plan  Quad sets, glute sets, TA isometrics, use of brace and crutches with 50% weightbearing and no active hip flexion STRONGLY encouraged.     Consulted and Agree with Plan of Care  Patient       Patient will benefit from skilled  therapeutic intervention in order to improve the following deficits and impairments:  Abnormal gait, Pain, Improper body mechanics, Decreased knowledge of use of DME, Decreased mobility, Decreased strength, Decreased range of motion, Decreased endurance, Decreased activity tolerance, Decreased balance, Decreased knowledge of precautions, Decreased safety awareness, Difficulty walking  Visit Diagnosis: Difficulty in walking, not elsewhere classified - Plan: PT plan of care cert/re-cert  Pain in left hip - Plan: PT plan of care cert/re-cert     Problem List Patient Active Problem List   Diagnosis Date Noted  . Lumbar radiculopathy 04/09/2016   Alva GarnetPatrick Cailen Mihalik PT, DPT, CSCS    04/14/2017, 3:02 PM  [facility] [facility] [address] [place], [place], [address] Phone: [phone]   Fax:  [phone]  Name: Anthony Ayala MRN: [id number] Date of Birth: 12/07/1975

## 2017-04-21 ENCOUNTER — Ambulatory Visit: Payer: Medicaid Other | Admitting: Physical Therapy

## 2017-06-23 ENCOUNTER — Ambulatory Visit: Payer: Medicaid Other | Attending: Orthopedic Surgery

## 2017-06-30 ENCOUNTER — Ambulatory Visit: Payer: Medicaid Other

## 2018-07-05 IMAGING — MR MR LUMBAR SPINE WO/W CM
4 of 7 series · 22 of 48 positions shown · IV contrast (20 ML MULTIHANCE)
Comparison: MRI lumbar spine 01/21/2016.

CLINICAL DATA: Low back and left buttock pain. Numbness in the left
calf. Symptoms began 1 week after L3-4 and L4-5 discectomy on
04/09/2016.

EXAM:
MRI LUMBAR SPINE WITHOUT AND WITH CONTRAST
TECHNIQUE: Multiplanar and multiecho pulse sequences of the lumbar spine were
obtained without and with intravenous contrast.
CONTRAST:  20 ml MULTIHANCE GADOBENATE DIMEGLUMINE 529 MG/ML IV SOLN

[Series 2: T2 · sagittal · 4.0mm · 0.81mm/px · 5 of 15 slices shown (1 of 2)]
[im 1/15]
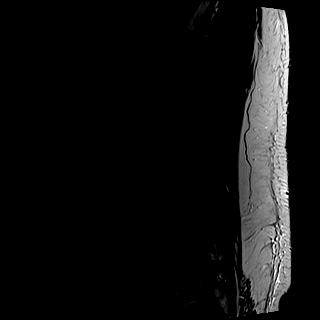
[im 4/15]
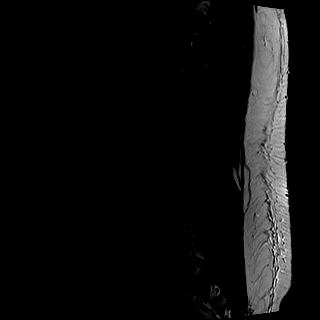
[im 8/15]
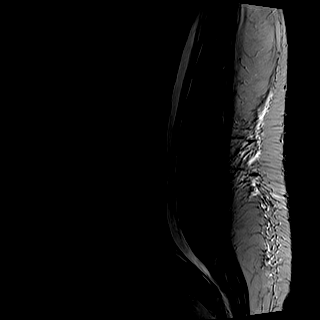
[im 11/15]
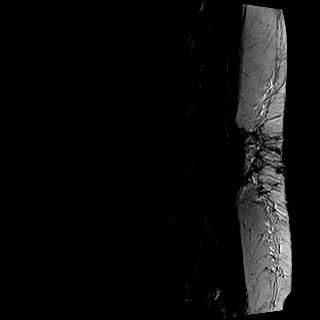
[im 15/15]
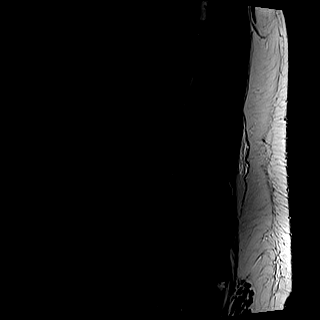

[Series 3: T1 · sagittal · 4.0mm · 0.41mm/px · 5 of 15 slices shown (1 of 2)]
[im 1/15]
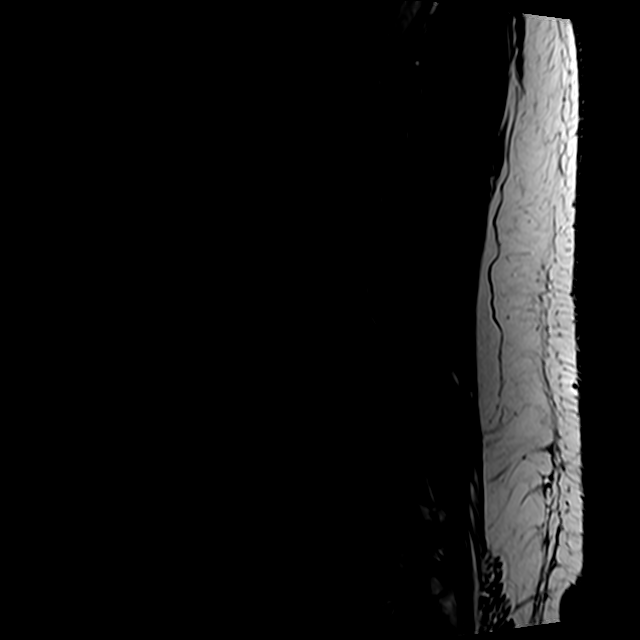
[im 4/15]
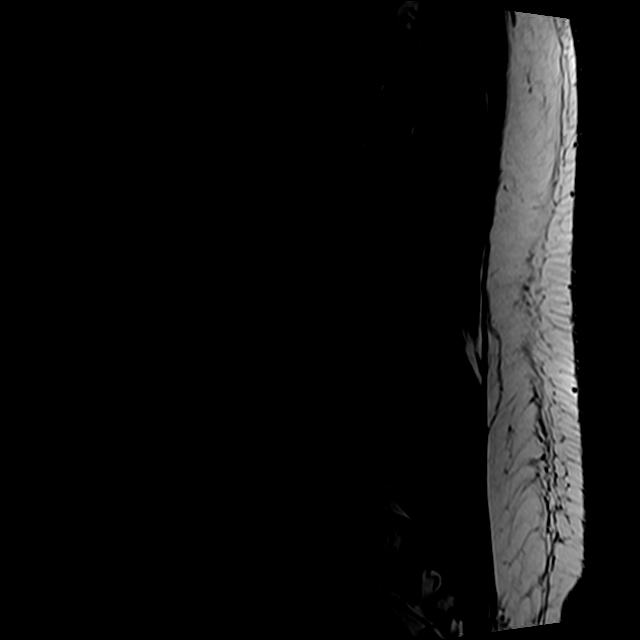
[im 8/15]
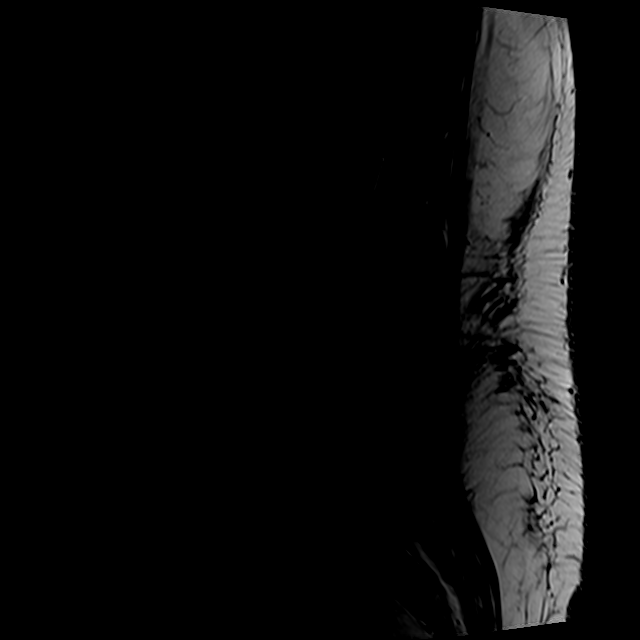
[im 11/15]
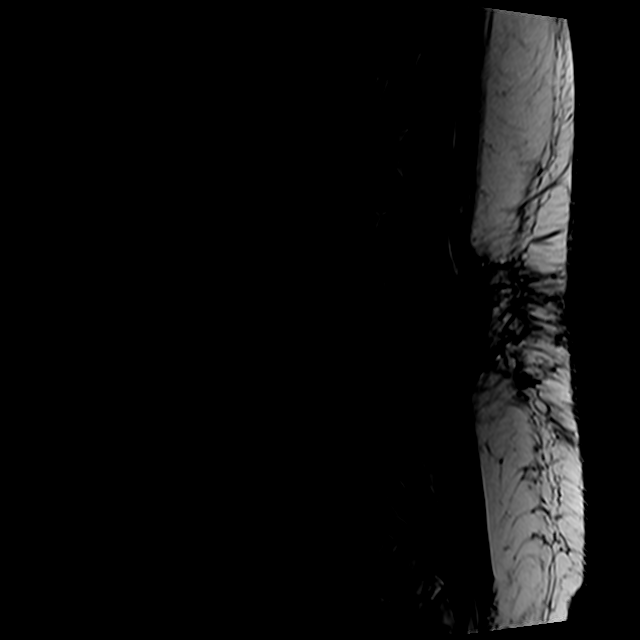
[im 15/15]
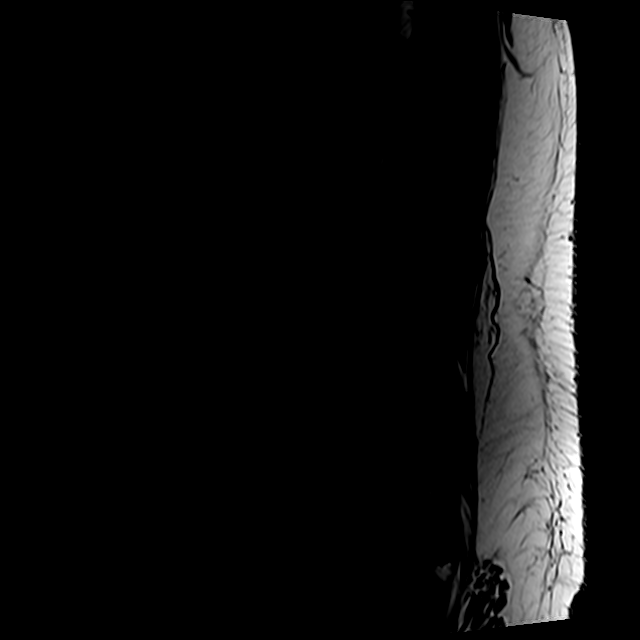

[Series 5: T2 · axial · 4.0mm · 0.78mm/px · z∈[-15,+184]mm · 8 of 35 slices shown (2 of 2)]
[im 1/35]
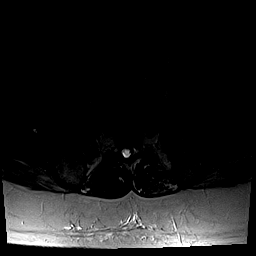
[im 4/35]
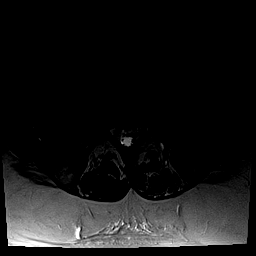
[im 12/35]
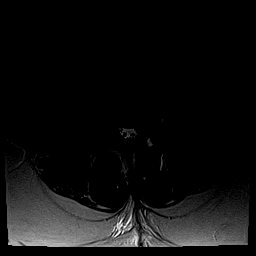
[im 16/35]
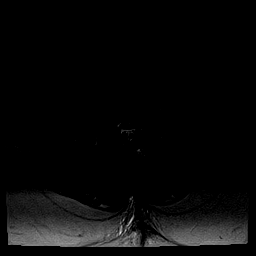
[im 19/35]
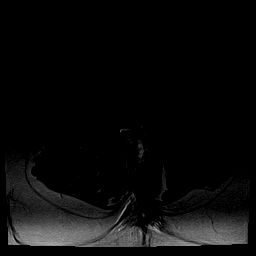
[im 23/35]
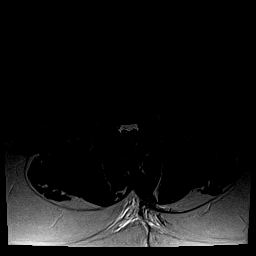
[im 31/35]
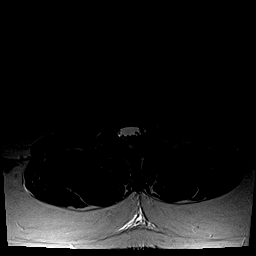
[im 35/35]
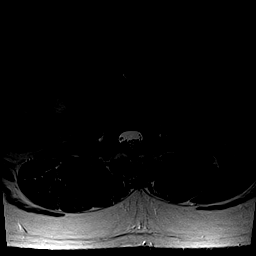

[Series 6: T1 · axial · 4.0mm · 0.31mm/px · z∈[-15,+165]mm · 4 of 35 slices shown (2 of 2)]
[im 1/35]
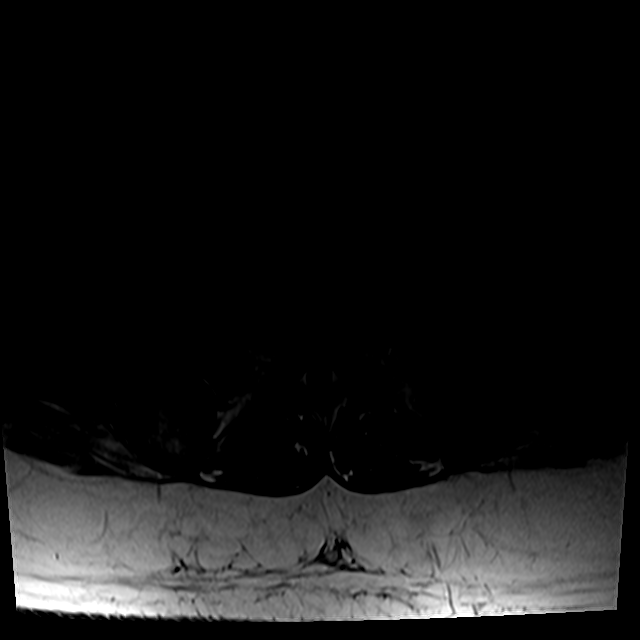
[im 4/35]
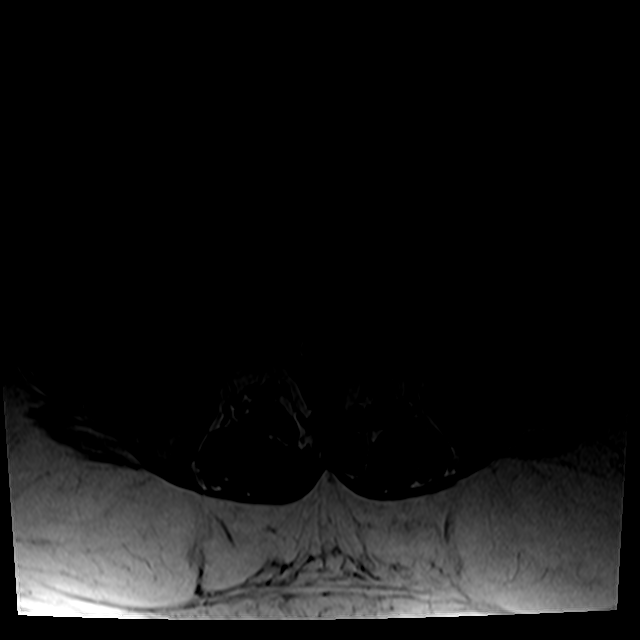
[im 19/35]
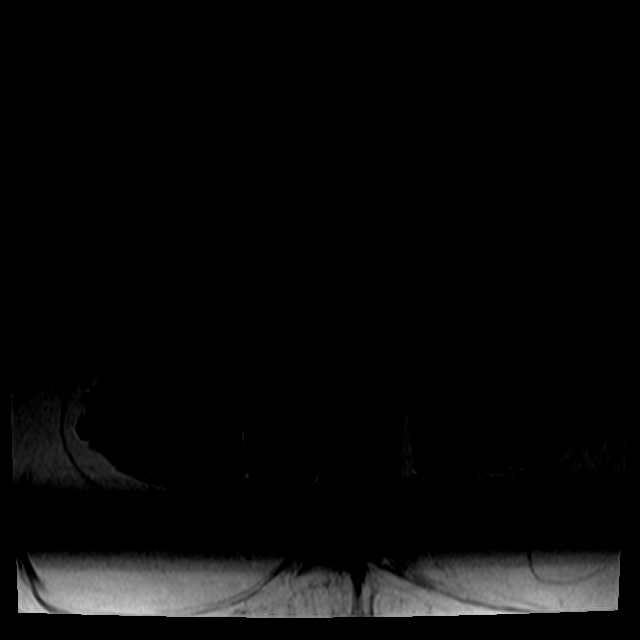
[im 31/35]
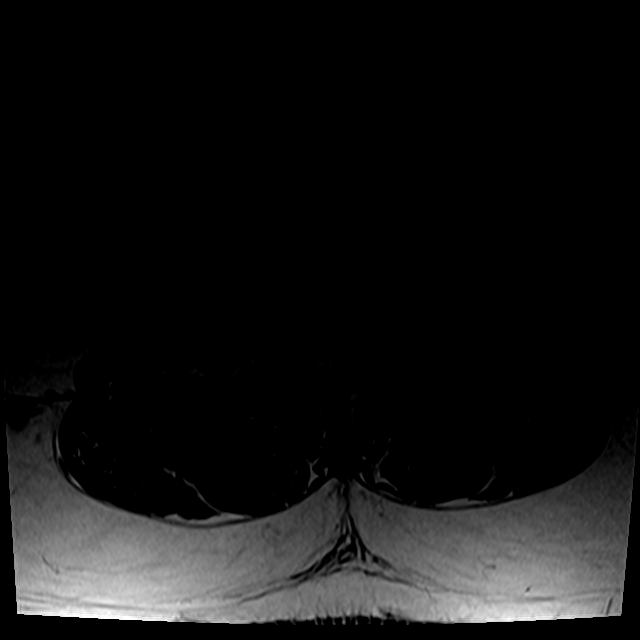

[22 of 48 positions shown; findings below may reference images not displayed]

FINDINGS: Segmentation:  Standard.

Alignment:  Maintained.

Vertebrae:  No fracture or worrisome lesion.

Conus medullaris: Extends to the T12 level and appears normal.

Paraspinal and other soft tissues: Negative.

Disc levels:

T12-L1 is imaged in the sagittal plane only and negative.

L1-2:  Negative.

L2-3:  Negative.

L3-4: The patient has undergone left laminotomy and partial
facetectomy for discectomy since the prior MRI. Small volume of
fluid in the left laminotomy bed measures 1.7 cm AP x 0.7 cm
transverse x 1.4 cm craniocaudal and is most consistent with a
seroma. Broad-based central protrusion with caudal extension is
identified. Protrusion in the left subarticular recess seen on the
prior examination has been resected. There is some enhancement about
the descending left L3 root from surgery. The root appears
decompressed. There is moderate central canal narrowing overall and
some narrowing in the right subarticular recess which is unchanged.

L4-5: Left laminotomy and partial facetectomy defect for discectomy
is identified. Broad-based disc bulge is seen. There is enhancement
about the descending left L5 root consistent with postoperative
change. The root does not appear compressed. The central canal and
right subarticular recess are mildly narrowed. The foramina are
open.

L5-S1: Very shallow disc bulge without central canal or foraminal
stenosis, unchanged.
IMPRESSION: Status post left discectomy at L3-4. Small postoperative seroma is
identified. Impingement on the descending left L4 root seen on the
comparison MRI appears relieved. There is a broad-based disc bulge
causing moderate central canal stenosis overall and narrowing in the
right subarticular recess.

Status post left discectomy at L4-5. Impingement on the descending
left L5 root seen on the prior examination is improved. There is a
shallow broad-based protrusion at this level causing mild central
canal narrowing.

## 2019-04-14 ENCOUNTER — Other Ambulatory Visit: Payer: Self-pay

## 2019-04-14 DIAGNOSIS — Z20822 Contact with and (suspected) exposure to covid-19: Secondary | ICD-10-CM

## 2019-04-15 ENCOUNTER — Telehealth: Payer: Self-pay

## 2019-04-15 LAB — NOVEL CORONAVIRUS, NAA: SARS-CoV-2, NAA: NOT DETECTED

## 2019-04-15 NOTE — Telephone Encounter (Signed)
Caller advise that result not back yet  

## 2020-01-10 ENCOUNTER — Emergency Department
Admission: EM | Admit: 2020-01-10 | Discharge: 2020-01-10 | Disposition: A | Discharge status: Home / Self Care | Payer: BLUE CROSS/BLUE SHIELD | Attending: Emergency Medicine | Admitting: Emergency Medicine

## 2020-01-10 ENCOUNTER — Encounter: Payer: Self-pay | Admitting: Emergency Medicine

## 2020-01-10 ENCOUNTER — Other Ambulatory Visit: Payer: Self-pay

## 2020-01-10 ENCOUNTER — Emergency Department: Payer: BLUE CROSS/BLUE SHIELD

## 2020-01-10 DIAGNOSIS — Z5321 Procedure and treatment not carried out due to patient leaving prior to being seen by health care provider: Secondary | ICD-10-CM | POA: Insufficient documentation

## 2020-01-10 DIAGNOSIS — R079 Chest pain, unspecified: Secondary | ICD-10-CM | POA: Diagnosis not present

## 2020-01-10 DIAGNOSIS — R202 Paresthesia of skin: Secondary | ICD-10-CM | POA: Diagnosis not present

## 2020-01-10 LAB — CBC
HCT: 44.5 % (ref 39.0–52.0)
Hemoglobin: 15.4 g/dL (ref 13.0–17.0)
MCH: 31.8 pg (ref 26.0–34.0)
MCHC: 34.6 g/dL (ref 30.0–36.0)
MCV: 91.8 fL (ref 80.0–100.0)
Platelets: 283 10*3/uL (ref 150–400)
RBC: 4.85 MIL/uL (ref 4.22–5.81)
RDW: 12.9 % (ref 11.5–15.5)
WBC: 7.9 10*3/uL (ref 4.0–10.5)
nRBC: 0 % (ref 0.0–0.2)

## 2020-01-10 LAB — BASIC METABOLIC PANEL
Anion gap: 10 (ref 5–15)
BUN: 11 mg/dL (ref 6–20)
CO2: 27 mmol/L (ref 22–32)
Calcium: 9.6 mg/dL (ref 8.9–10.3)
Chloride: 104 mmol/L (ref 98–111)
Creatinine, Ser: 0.84 mg/dL (ref 0.61–1.24)
GFR calc Af Amer: >60 mL/min (ref >60)
GFR calc non Af Amer: >60 mL/min (ref >60)
Glucose, Bld: 84 mg/dL (ref 70–99)
Potassium: 4.2 mmol/L (ref 3.5–5.1)
Sodium: 141 mmol/L (ref 135–145)

## 2020-01-10 LAB — TROPONIN I (HIGH SENSITIVITY): Troponin I (High Sensitivity): 8 ng/L (ref <18)

## 2020-01-10 NOTE — ED Notes (Signed)
Called in the waiting room with no answer. °

## 2020-01-10 NOTE — ED Notes (Signed)
Sent blood to lab. 

## 2020-01-10 NOTE — ED Triage Notes (Signed)
Pt reports cp that started this am to his mid chest and some numbness in his left hand. Pt denies SOB or other sx's.

## 2020-01-11 ENCOUNTER — Telehealth: Payer: Self-pay | Admitting: Emergency Medicine

## 2020-01-11 NOTE — Telephone Encounter (Signed)
Patient called me asking about his test resutls .  He left due to long wait.  He has made appt with [facility] p[facility] at [facility].  I told him to call them back and have them review his results from here.  He will call them.

## 2021-09-25 ENCOUNTER — Encounter: Payer: Self-pay | Admitting: Family Medicine

## 2022-08-11 ENCOUNTER — Ambulatory Visit (INDEPENDENT_AMBULATORY_CARE_PROVIDER_SITE_OTHER): Payer: BC Managed Care – PPO | Admitting: Internal Medicine

## 2022-08-11 VITALS — BP 166/110 | HR 91 | Resp 14 | Ht 68.5 in | Wt 218.0 lb

## 2022-08-11 DIAGNOSIS — G4730 Sleep apnea, unspecified: Secondary | ICD-10-CM | POA: Diagnosis not present

## 2022-08-11 DIAGNOSIS — I10 Essential (primary) hypertension: Secondary | ICD-10-CM | POA: Diagnosis not present

## 2022-08-11 DIAGNOSIS — E669 Obesity, unspecified: Secondary | ICD-10-CM | POA: Diagnosis not present

## 2022-08-11 NOTE — Patient Instructions (Signed)

## 2022-08-11 NOTE — Progress Notes (Signed)
Sleep Medicine   Office Visit  Patient Name: Anthony ReamScott Ayala DOB: 1975/06/08 MRN [id number]    Chief Complaint: sleep evaluation  Brief History:  Anthony Ayala presents with a 20 year history of loud snoring. This is worse in the supine position.   Sleep quality is good. This is noted most nights. The patient's bed partner reports loud snoring and witnessed apnea at night. The patient relates the following symptoms: loud snoring, gasping and morning headaches are also present. The patient goes to sleep at 9:30 pm and wakes up at 3-4:30 am. Sleep quality is same when outside home environment.  Patient has noted restlessness of his legs at night.  The patient  relates no unusual behavior during the night.  The patient denies a history of psychiatric problems. The Epworth Sleepiness Score is 7 out of 24 .  The patient relates  Cardiovascular risk factors include: hypertension.     ROS  General: (-) fever, (-) chills, (-) night sweat Nose and Sinuses: (-) nasal stuffiness or itchiness, (-) postnasal drip, (-) nosebleeds, (-) sinus trouble. Mouth and Throat: (-) sore throat, (-) hoarseness. Neck: (-) swollen glands, (-) enlarged thyroid, (-) neck pain. Respiratory: - cough, - shortness of breath, - wheezing. Neurologic: - numbness, - tingling. Psychiatric: - anxiety, - depression Sleep behavior: -sleep paralysis -hypnogogic hallucinations -dream enactment      -vivid dreams -cataplexy -night terrors -sleep walking   Current Medication: Outpatient Encounter Medications as of 08/11/2022  Medication Sig   [DISCONTINUED] diazepam (VALIUM) 5 MG tablet Take 1 tablet (5 mg total) by mouth every 6 (six) hours as needed for muscle spasms.   [DISCONTINUED] HYDROcodone-acetaminophen (NORCO/VICODIN) 5-325 MG tablet Take 1-2 tablets by mouth every 4 (four) hours as needed (mild pain).   [DISCONTINUED] ibuprofen (ADVIL,MOTRIN) 800 MG tablet Take 1 tablet (800 mg total) by mouth 3 (three) times daily. (Patient not  taking: Reported on 03/24/2016)   [DISCONTINUED] tiZANidine (ZANAFLEX) 4 MG capsule Take 1 capsule (4 mg total) by mouth 4 (four) times daily as needed for muscle spasms. (Patient not taking: Reported on 03/24/2016)   No facility-administered encounter medications on file as of 08/11/2022.    Surgical History: Past Surgical History:  Procedure Laterality Date   CYST REMOVAL NECK     FOOT SURGERY     KNEE SURGERY     LUMBAR LAMINECTOMY/DECOMPRESSION MICRODISCECTOMY N/A 04/09/2016   Procedure: left L3-4. L4-5 microdisectomy;  Surgeon: Karenann CaiMuhammad Abd-El-Barr, MD;  Location: [facility] ORS;  Service: Neurosurgery;  Laterality: N/A;   SHOULDER ARTHROSCOPY Right 2012    Medical History: Past Medical History:  Diagnosis Date   Arthritis    right ankle   Chronic back pain    History of kidney stones     Family History: Non contributory to the present illness  Social History: Social History   Socioeconomic History   Marital status: Married    Spouse name: Not on file   Number of children: Not on file   Years of education: Not on file   Highest education level: Not on file  Occupational History   Not on file  Tobacco Use   Smoking status: Some Days    Packs/day: 1    Types: Cigarettes    Last attempt to quit: 06/11/2015    Years since quitting: 7.1   Smokeless tobacco: Never  Substance and Sexual Activity   Alcohol use: Yes    Comment: 4beers/week   Drug use: No   Sexual activity: Yes    Birth control/protection:  None  Other Topics Concern   Not on file  Social History Narrative   Not on file   Social Determinants of Health   Financial Resource Strain: Not on file  Food Insecurity: Not on file  Transportation Needs: Not on file  Physical Activity: Not on file  Stress: Not on file  Social Connections: Not on file  Intimate Partner Violence: Not on file    Vital Signs: Blood pressure (!) 166/110, pulse 91, resp. rate 14, height 5' 8.5" (1.74 m), weight 218 lb (98.9 kg),  SpO2 97 %. Body mass index is 32.66 kg/m.   Examination: General Appearance: The patient is well-developed, well-nourished, and in no distress. Neck Circumference: 43 cm Skin: Gross inspection of skin unremarkable. Head: normocephalic, no gross deformities. Eyes: no gross deformities noted. ENT: ears appear grossly normal Neurologic: Alert and oriented. No involuntary movements.    STOP BANG RISK ASSESSMENT S (snore) Have you been told that you snore?     YES   T (tired) Are you often tired, fatigued, or sleepy during the day?   NO  O (obstruction) Do you stop breathing, choke, or gasp during sleep? YES   P (pressure) Do you have or are you being treated for high blood pressure? NO   B (BMI) Is your body index greater than 35 kg/m? NO   A (age) Are you 47 years old or older? NO   N (neck) Do you have a neck circumference greater than 16 inches?   YES   G (gender) Are you a male? YES   TOTAL STOP/BANG "YES" ANSWERS 4                                                               A STOP-Bang score of 2 or less is considered low risk, and a score of 5 or more is high risk for having either moderate or severe OSA. For people who score 3 or 4, doctors may need to perform further assessment to determine how likely they are to have OSA.         EPWORTH SLEEPINESS SCALE:  Scale:  (0)= no chance of dozing; (1)= slight chance of dozing; (2)= moderate chance of dozing; (3)= high chance of dozing  Chance  Situtation    Sitting and reading: 3    Watching TV: 2    Sitting Inactive in public: 1    As a passenger in car: 0      Lying down to rest: 1    Sitting and talking: 0    Sitting quielty after lunch: 0    In a car, stopped in traffic: 0   TOTAL SCORE:   7 out of 24    SLEEP STUDIES:  Has had a sleep study in the past but it was inconclusive because he did not sleep   LABS: No results found for this or any previous visit (from the past 2160  hour(s)).  Radiology: DG Chest 2 View  Result Date: 01/10/2020 CLINICAL DATA:  Central chest pain EXAM: CHEST - 2 VIEW COMPARISON:  04/01/2016 FINDINGS: The heart size and mediastinal contours are within normal limits. Both lungs are clear. The visualized skeletal structures are unremarkable. IMPRESSION: No active cardiopulmonary disease. Electronically Signed   By: Alcide Clever  M.D.   On: 01/10/2020 13:53    No results found.  No results found.    Assessment and Plan: Patient Active Problem List   Diagnosis Date Noted   Observed sleep apnea 08/11/2022   Obesity (BMI 30-39.9) 08/11/2022   Hypertension 08/11/2022   Enthesopathy of left hip region 09/17/2016   Lumbar radiculopathy 04/09/2016   1. Observed sleep apnea PLAN OSA:   Patient evaluation suggests high risk of sleep disordered breathing due to snoring, witnessed apnea, morning headaches, gasping and daytime sleepiness.  Patient has comorbid cardiovascular risk factors including: hypertension which could be exacerbated by pathologic sleep-disordered breathing.  Suggest: home sleep study (pt preference)  to assess/treat the patient's sleep disordered breathing. The patient was also counselled on weight loss to optimize sleep health.    2. Obesity (BMI 30-39.9) Obesity Counseling: Had a lengthy discussion regarding patients BMI and weight issues. Patient was instructed on portion control as well as increased activity. Also discussed caloric restrictions with trying to maintain intake less than 2000 Kcal. Discussions were made in accordance with the 5As of weight management. Simple actions such as not eating late and if able to, taking a walk is suggested.   3. Hypertension, unspecified type Hypertension Counseling:   The following hypertensive lifestyle modification were recommended and discussed:  1. Limiting alcohol intake to less than 1 oz/day of ethanol:(24 oz of beer or 8 oz of wine or 2 oz of 100-proof whiskey). 2.  Take baby ASA 81 mg daily. 3. Importance of regular aerobic exercise and losing weight. 4. Reduce dietary saturated fat and cholesterol intake for overall cardiovascular health. 5. Maintaining adequate dietary potassium, calcium, and magnesium intake. 6. Regular monitoring of the blood pressure. 7. Reduce sodium intake to less than 100 mmol/day (less than 2.3 gm of sodium or less than 6 gm of sodium choride)     General Counseling: I have discussed the findings of the evaluation and examination with Sincere.  I have also discussed any further diagnostic evaluation thatmay be needed or ordered today. Anthony Ayala verbalizes understanding of the findings of todays visit. We also reviewed his medications today and discussed drug interactions and side effects including but not limited excessive drowsiness and altered mental states. We also discussed that there is always a risk not just to him but also people around him. he has been encouraged to call the office with any questions or concerns that should arise related to todays visit.  No orders of the defined types were placed in this encounter.       I have personally obtained a history, evaluated the patient, evaluated pertinent data, formulated the assessment and plan and placed orders.   This patient was seen today by Emmaline Kluver, PA-C in collaboration with Dr. Freda Munro.   Yevonne Pax, MD [facility] Diplomate ABMS Pulmonary and Critical Care Medicine Sleep medicine

## 2023-08-03 ENCOUNTER — Emergency Department

## 2023-08-03 ENCOUNTER — Emergency Department
Admission: EM | Admit: 2023-08-03 | Discharge: 2023-08-03 | Disposition: A | Discharge status: Home / Self Care | Payer: Worker's Compensation | Attending: Emergency Medicine | Admitting: Emergency Medicine

## 2023-08-03 ENCOUNTER — Other Ambulatory Visit: Payer: Self-pay

## 2023-08-03 ENCOUNTER — Emergency Department: Payer: Worker's Compensation

## 2023-08-03 DIAGNOSIS — X500XXA Overexertion from strenuous movement or load, initial encounter: Secondary | ICD-10-CM | POA: Insufficient documentation

## 2023-08-03 DIAGNOSIS — S46219A Strain of muscle, fascia and tendon of other parts of biceps, unspecified arm, initial encounter: Secondary | ICD-10-CM

## 2023-08-03 DIAGNOSIS — M79601 Pain in right arm: Secondary | ICD-10-CM | POA: Diagnosis present

## 2023-08-03 DIAGNOSIS — S46211A Strain of muscle, fascia and tendon of other parts of biceps, right arm, initial encounter: Secondary | ICD-10-CM | POA: Insufficient documentation

## 2023-08-03 NOTE — ED Provider Notes (Addendum)
 [facility] Provider Note    Event Date/Time   First MD Initiated Contact with Patient 08/03/23 1650     (approximate)   History   Shoulder Injury   HPI  Anthony Ayala is a 48 y.o. male history of kidney stones, chronic back pain and arthritis presents emergency department with left arm pain.  Patient was lifting a bed to help patient move into the facility.  Felt a large pop.  States it was loud and sounded like a gunshot.  Has pain with arm movement.  States fine if he is not moving but if he tries to touch the bicep or the area below it is very painful.  No numbness or tingling.  Went to the [facility] walk-in and he was told to come here first due to [employer].      Physical Exam   Triage Vital Signs: ED Triage Vitals  Encounter Vitals Group     BP 08/03/23 1545 (!) 139/91     Systolic BP Percentile --      Diastolic BP Percentile --      Pulse Rate 08/03/23 1545 87     Resp 08/03/23 1545 20     Temp 08/03/23 1545 98 F (36.7 C)     Temp Source 08/03/23 1545 Oral     SpO2 08/03/23 1545 98 %     Weight 08/03/23 1548 230 lb (104.3 kg)     Height 08/03/23 1548 5\' 8"  (1.727 m)     Head Circumference --      Peak Flow --      Pain Score 08/03/23 1546 4     Pain Loc --      Pain Education --      Exclude from Growth Chart --     Most recent vital signs: Vitals:   08/03/23 1545  BP: (!) 139/91  Pulse: 87  Resp: 20  Temp: 98 F (36.7 C)  SpO2: 98%     General: Awake, no distress.   CV:  Good peripheral perfusion. regular rate and  rhythm Resp:  Normal effort.  Abd:  No distention.   Other:  Spongy area noted at the area of the distal bicep insertion, Popeye muscle noted, neurovascular intact, area is very tender to palpation   ED Results / Procedures / Treatments   Labs (all labs ordered are listed, but only abnormal results are displayed) Labs Reviewed - No data to display   EKG     RADIOLOGY X-ray left  elbow    PROCEDURES:   Procedures Chief Complaint  Patient presents with   Shoulder Injury      MEDICATIONS ORDERED IN ED: Medications - No data to display   IMPRESSION / MDM / ASSESSMENT AND PLAN / ED COURSE  I reviewed the triage vital signs and the nursing notes.                              Differential diagnosis includes, but is not limited to, left arm strain, bicep tendon tear, muscle tear, deltoid tear, rotator cuff injury  Patient's presentation is most consistent with acute illness / injury with system symptoms.   X-ray left elbow independently reviewed interpreted by me as being negative for any acute abnormality  Patient's exam is most consistent with a bicep tendon tear.  He already has a sling.  Will give him a ice pack.  Will contact Ortho to  see if they do want any imaging other than the x-ray.    Spoke with Dr.Poggi, states go ahead and get MRI and he will follow-up with him this week  MRI of the humerus ordered  Care transferred to Lauren Doughterty, PA-C     FINAL CLINICAL IMPRESSION(S) / ED DIAGNOSES   Final diagnoses:  Biceps tendon tear     Rx / DC Orders   ED Discharge Orders     None        Note:  This document was prepared using Dragon voice recognition software and may include unintentional dictation errors.    Faythe Ghee, PA-C 08/03/23 1840    Sherrie Mustache Roselyn Bering, PA-C 08/03/23 1913    Merwyn Katos, MD 08/03/23 [phone]

## 2023-08-03 NOTE — ED Provider Notes (Signed)
-----------------------------------------   7:02 PM on 08/03/2023 -----------------------------------------  Blood pressure (!) 139/91, pulse 87, temperature 98 F (36.7 C), temperature source Oral, resp. rate 20, height 5\' 8"  (1.727 m), weight 104.3 kg, SpO2 98%.  Assuming care from Greig Right, PA-C.  In short, Anthony Ayala is a 48 y.o. male with a chief complaint of Shoulder Injury .  Refer to the original H&P for additional details.  The current plan of care is to wait obtain Mri of the left elbow and then discharge.  ____________________________________________    ED Results / Procedures / Treatments   Labs (all labs ordered are listed, but only abnormal results are displayed) Labs Reviewed - No data to display   RADIOLOGY  I personally viewed and evaluated these images as part of my medical decision making, as well as reviewing the written report by the radiologist.  ED Provider Interpretation: Negative xray. MRI results are pending.  DG Elbow 2 Views Left Result Date: 08/03/2023 CLINICAL DATA:  Lifting injury this morning. Patient heard a pop. Possible biceps tendon injury. EXAM: LEFT ELBOW - 2 VIEW COMPARISON:  None Available. FINDINGS: The mineralization and alignment are normal. There is no evidence of acute fracture or dislocation. The joint spaces are preserved. No evidence of joint effusion, foreign body or soft tissue emphysema. IMPRESSION: Unremarkable radiographs. If concern of biceps tendon injury, recommend further evaluation with MRI. Electronically Signed   By: Carey Bullocks M.D.   On: 08/03/2023 16:52     PROCEDURES:  Critical Care performed: No  Procedures   MEDICATIONS ORDERED IN ED: Medications - No data to display   IMPRESSION / MDM / ASSESSMENT AND PLAN / ED COURSE  I reviewed the triage vital signs and the nursing notes.                             48 year old male presents for evaluation of left elbow injury. BP is elevated, VSS otherwise.  Patient NAD on exam.   Differential diagnosis includes, but is not limited to, biceps tendon rupture, muscle strain, elbow fracture.  Patient's presentation is most consistent with acute complicated illness / injury requiring diagnostic workup.  MRI images have been obtained.  Dr. Edmund Hilda G explained that patient does not need to wait for results of the MRI.  Patient can be discharged and will follow-up with him as an outpatient.  Patient's diagnosis is consistent with biceps tendon tear.  Patient is to follow up with Dr. Joice Lofts as needed or otherwise directed. Patient is given ED precautions to return to the ED for any worsening or new symptoms.     FINAL CLINICAL IMPRESSION(S) / ED DIAGNOSES   Final diagnoses:  Biceps tendon tear     Rx / DC Orders   ED Discharge Orders     None        Note:  This document was prepared using Dragon voice recognition software and may include unintentional dictation errors.    Cameron Ali, PA-C 08/03/23 2011    Merwyn Katos, MD 08/03/23 [phone]

## 2023-08-03 NOTE — ED Triage Notes (Addendum)
 Pt to ED via POV from home. Pt reports this morning was lifitng and moving a patients bed and heard something pop in left arm. Pt went to UC and they told him he probably has a torn bicep and needs MRI. Pulse intact. Pt reports thumb feels more numb.    This is worker's comp. Pt works at [employer].   Claim number [id number]  PA in triage for assessment and orders

## 2023-08-03 NOTE — ED Provider Triage Note (Signed)
 Emergency Medicine Provider Triage Evaluation Note  Anthony Ayala , a 48 y.o. male  was evaluated in triage.  Pt complains of pain in his anterior elbow. Patient was lifting something when he felt a pop in the anterior elbow.  Review of Systems  Positive: Pain to the anterior elbow Negative:   Physical Exam  BP (!) 139/91   Pulse 87   Temp 98 F (36.7 C) (Oral)   Resp 20   Ht 5\' 8"  (1.727 m)   Wt 104.3 kg   SpO2 98%   BMI 34.97 kg/m  Gen:   Awake, no distress   Resp:  Normal effort  MSK:   Moves extremities without difficulty  Other:  TTP over the distal insertion of the biceps tendon, inability to pronate  Medical Decision Making  Medically screening exam initiated at 3:58 PM.  Appropriate orders placed.  Anthony Ayala was informed that the remainder of the evaluation will be completed by another provider, this initial triage assessment does not replace that evaluation, and the importance of remaining in the ED until their evaluation is complete.    Anthony Ali, PA-C 08/03/23 1559

## 2023-08-03 NOTE — Discharge Instructions (Addendum)
 Based on your exam today, I believe you have torn your biceps tendon. Keep your arm in the sling and apply ice as needed. Do not use your left arm.   Call Dr. Binnie Rail office tomorrow morning to see if they can fit you on his surgery schedule.

## 2023-08-05 ENCOUNTER — Other Ambulatory Visit: Payer: Self-pay | Admitting: Surgery

## 2023-08-05 ENCOUNTER — Ambulatory Visit
Admission: RE | Admit: 2023-08-05 | Discharge: 2023-08-05 | Disposition: A | Discharge status: Home / Self Care | Payer: Worker's Compensation | Source: Ambulatory Visit | Attending: Surgery | Admitting: Surgery

## 2023-08-05 DIAGNOSIS — S46212A Strain of muscle, fascia and tendon of other parts of biceps, left arm, initial encounter: Secondary | ICD-10-CM | POA: Diagnosis present

## 2023-08-06 ENCOUNTER — Ambulatory Visit
Admission: RE | Admit: 2023-08-06 | Discharge: 2023-08-06 | Disposition: A | Discharge status: Home / Self Care | Payer: Worker's Compensation | Source: Ambulatory Visit | Attending: Surgery | Admitting: Surgery

## 2023-08-06 ENCOUNTER — Ambulatory Visit: Payer: Worker's Compensation

## 2023-08-06 ENCOUNTER — Encounter
Admission: RE | Disposition: A | Discharge status: Home / Self Care | Payer: Self-pay | Source: Ambulatory Visit | Attending: Surgery

## 2023-08-06 ENCOUNTER — Other Ambulatory Visit: Payer: Self-pay

## 2023-08-06 ENCOUNTER — Ambulatory Visit: Payer: Worker's Compensation | Admitting: Certified Registered"

## 2023-08-06 ENCOUNTER — Encounter: Payer: Self-pay | Admitting: Surgery

## 2023-08-06 ENCOUNTER — Other Ambulatory Visit: Payer: Self-pay | Admitting: Surgery

## 2023-08-06 DIAGNOSIS — G4730 Sleep apnea, unspecified: Secondary | ICD-10-CM | POA: Insufficient documentation

## 2023-08-06 DIAGNOSIS — S46212A Strain of muscle, fascia and tendon of other parts of biceps, left arm, initial encounter: Secondary | ICD-10-CM | POA: Insufficient documentation

## 2023-08-06 DIAGNOSIS — M549 Dorsalgia, unspecified: Secondary | ICD-10-CM | POA: Insufficient documentation

## 2023-08-06 DIAGNOSIS — Z419 Encounter for procedure for purposes other than remedying health state, unspecified: Secondary | ICD-10-CM

## 2023-08-06 DIAGNOSIS — G8929 Other chronic pain: Secondary | ICD-10-CM | POA: Diagnosis not present

## 2023-08-06 DIAGNOSIS — F17200 Nicotine dependence, unspecified, uncomplicated: Secondary | ICD-10-CM | POA: Diagnosis not present

## 2023-08-06 DIAGNOSIS — X500XXA Overexertion from strenuous movement or load, initial encounter: Secondary | ICD-10-CM | POA: Diagnosis not present

## 2023-08-06 HISTORY — PX: DISTAL BICEPS TENDON REPAIR: SHX1461

## 2023-08-06 SURGERY — REPAIR, TENDON, BICEPS, DISTAL
Anesthesia: General | Site: Elbow | Laterality: Left

## 2023-08-06 MED ORDER — DROPERIDOL 2.5 MG/ML IJ SOLN: 0.6250 mg | Freq: Once | INTRAMUSCULAR | Status: DC | PRN

## 2023-08-06 MED ORDER — ONDANSETRON HCL 4 MG/2ML IJ SOLN
INTRAMUSCULAR | Status: DC | PRN
  Administered 2023-08-06: 4 mg via INTRAVENOUS

## 2023-08-06 MED ORDER — ORAL CARE MOUTH RINSE: 15.0000 mL | Freq: Once | OROMUCOSAL | Status: AC

## 2023-08-06 MED ORDER — ACETAMINOPHEN 10 MG/ML IV SOLN
INTRAVENOUS | Status: AC
  Filled 2023-08-06: qty 100

## 2023-08-06 MED ORDER — HYDROCODONE-ACETAMINOPHEN 5-325 MG PO TABS: 1.0000 | ORAL_TABLET | Freq: Four times a day (QID) | ORAL | 0 refills | Status: AC | PRN

## 2023-08-06 MED ORDER — LIDOCAINE HCL (PF) 1 % IJ SOLN
INTRAMUSCULAR | Status: AC
  Filled 2023-08-06: qty 2

## 2023-08-06 MED ORDER — OXYCODONE HCL 5 MG PO TABS: 5.0000 mg | ORAL_TABLET | Freq: Once | ORAL | Status: DC | PRN

## 2023-08-06 MED ORDER — MIDAZOLAM HCL 2 MG/2ML IJ SOLN
INTRAMUSCULAR | Status: DC | PRN
  Administered 2023-08-06: 2 mg via INTRAVENOUS

## 2023-08-06 MED ORDER — GLYCOPYRROLATE 0.2 MG/ML IJ SOLN
INTRAMUSCULAR | Status: DC | PRN
  Administered 2023-08-06: .2 mg via INTRAVENOUS

## 2023-08-06 MED ORDER — 0.9 % SODIUM CHLORIDE (POUR BTL) OPTIME
TOPICAL | Status: DC | PRN
Start: 2023-08-06 — End: 2023-08-06
  Administered 2023-08-06: 1000 mL

## 2023-08-06 MED ORDER — DEXMEDETOMIDINE HCL IN NACL 80 MCG/20ML IV SOLN
INTRAVENOUS | Status: DC | PRN
Start: 2023-08-06 — End: 2023-08-06
  Administered 2023-08-06: 8 ug via INTRAVENOUS
  Administered 2023-08-06: 12 ug via INTRAVENOUS
  Administered 2023-08-06: 8 ug via INTRAVENOUS
  Administered 2023-08-06: 12 ug via INTRAVENOUS

## 2023-08-06 MED ORDER — PROPOFOL 10 MG/ML IV BOLUS
INTRAVENOUS | Status: AC
  Filled 2023-08-06: qty 20

## 2023-08-06 MED ORDER — PHENYLEPHRINE 80 MCG/ML (10ML) SYRINGE FOR IV PUSH (FOR BLOOD PRESSURE SUPPORT)
PREFILLED_SYRINGE | INTRAVENOUS | Status: DC | PRN
  Administered 2023-08-06: 80 ug via INTRAVENOUS
  Administered 2023-08-06: 160 ug via INTRAVENOUS
  Administered 2023-08-06 (×4): 80 ug via INTRAVENOUS

## 2023-08-06 MED ORDER — DEXTROSE-SODIUM CHLORIDE 5-0.9 % IV SOLN: INTRAVENOUS | Status: DC

## 2023-08-06 MED ORDER — OXYCODONE HCL 5 MG/5ML PO SOLN: 5.0000 mg | Freq: Once | ORAL | Status: DC | PRN

## 2023-08-06 MED ORDER — PROPOFOL 10 MG/ML IV BOLUS
INTRAVENOUS | Status: DC | PRN
  Administered 2023-08-06: 40 mg via INTRAVENOUS

## 2023-08-06 MED ORDER — CEFAZOLIN SODIUM-DEXTROSE 2-4 GM/100ML-% IV SOLN
2.0000 g | INTRAVENOUS | Status: AC
  Administered 2023-08-06: 2 g via INTRAVENOUS

## 2023-08-06 MED ORDER — LIDOCAINE HCL (PF) 1 % IJ SOLN
INTRAMUSCULAR | Status: DC | PRN
  Administered 2023-08-06: 1 mL via SUBCUTANEOUS

## 2023-08-06 MED ORDER — ACETAMINOPHEN 325 MG PO TABS: 325.0000 mg | ORAL_TABLET | Freq: Four times a day (QID) | ORAL | Status: DC | PRN

## 2023-08-06 MED ORDER — SODIUM CHLORIDE 0.9 % IV BOLUS
500.0000 mL | Freq: Once | INTRAVENOUS | Status: AC
  Administered 2023-08-06: 500 mL via INTRAVENOUS

## 2023-08-06 MED ORDER — ACETAMINOPHEN 10 MG/ML IV SOLN: 1000.0000 mg | Freq: Once | INTRAVENOUS | Status: DC | PRN

## 2023-08-06 MED ORDER — KETOROLAC TROMETHAMINE 30 MG/ML IJ SOLN: 30.0000 mg | Freq: Once | INTRAMUSCULAR | Status: DC

## 2023-08-06 MED ORDER — LACTATED RINGERS IV SOLN: INTRAVENOUS | Status: DC

## 2023-08-06 MED ORDER — PROPOFOL 1000 MG/100ML IV EMUL
INTRAVENOUS | Status: AC
  Filled 2023-08-06: qty 100

## 2023-08-06 MED ORDER — METOCLOPRAMIDE HCL 5 MG/ML IJ SOLN: 5.0000 mg | Freq: Three times a day (TID) | INTRAMUSCULAR | Status: DC | PRN

## 2023-08-06 MED ORDER — ONDANSETRON HCL 4 MG/2ML IJ SOLN: 4.0000 mg | Freq: Four times a day (QID) | INTRAMUSCULAR | Status: DC | PRN

## 2023-08-06 MED ORDER — FENTANYL CITRATE (PF) 100 MCG/2ML IJ SOLN: 25.0000 ug | INTRAMUSCULAR | Status: DC | PRN

## 2023-08-06 MED ORDER — PROPOFOL 500 MG/50ML IV EMUL
INTRAVENOUS | Status: DC | PRN
  Administered 2023-08-06: 120 ug/kg/min via INTRAVENOUS

## 2023-08-06 MED ORDER — METOCLOPRAMIDE HCL 10 MG PO TABS: 5.0000 mg | ORAL_TABLET | Freq: Three times a day (TID) | ORAL | Status: DC | PRN

## 2023-08-06 MED ORDER — CHLORHEXIDINE GLUCONATE 0.12 % MT SOLN
OROMUCOSAL | Status: AC
  Filled 2023-08-06: qty 15

## 2023-08-06 MED ORDER — ACETAMINOPHEN 10 MG/ML IV SOLN
INTRAVENOUS | Status: DC | PRN
Start: 2023-08-06 — End: 2023-08-06
  Administered 2023-08-06: 1000 mg via INTRAVENOUS

## 2023-08-06 MED ORDER — ONDANSETRON HCL 4 MG PO TABS: 4.0000 mg | ORAL_TABLET | Freq: Four times a day (QID) | ORAL | Status: DC | PRN

## 2023-08-06 MED ORDER — LIDOCAINE HCL (CARDIAC) PF 100 MG/5ML IV SOSY
PREFILLED_SYRINGE | INTRAVENOUS | Status: DC | PRN
  Administered 2023-08-06: 30 mg via INTRAVENOUS

## 2023-08-06 MED ORDER — LIDOCAINE HCL (PF) 2 % IJ SOLN
INTRAMUSCULAR | Status: AC
  Filled 2023-08-06: qty 5

## 2023-08-06 MED ORDER — MIDAZOLAM HCL 2 MG/2ML IJ SOLN
INTRAMUSCULAR | Status: AC
  Filled 2023-08-06: qty 2

## 2023-08-06 MED ORDER — BUPIVACAINE HCL (PF) 0.5 % IJ SOLN
INTRAMUSCULAR | Status: DC | PRN
  Administered 2023-08-06: 20 mL via PERINEURAL

## 2023-08-06 MED ORDER — BUPIVACAINE HCL (PF) 0.5 % IJ SOLN
INTRAMUSCULAR | Status: AC
  Filled 2023-08-06: qty 20

## 2023-08-06 MED ORDER — FENTANYL CITRATE (PF) 100 MCG/2ML IJ SOLN
INTRAMUSCULAR | Status: DC | PRN
  Administered 2023-08-06: 50 ug via INTRAVENOUS

## 2023-08-06 MED ORDER — CHLORHEXIDINE GLUCONATE 0.12 % MT SOLN
15.0000 mL | Freq: Once | OROMUCOSAL | Status: AC
  Administered 2023-08-06: 15 mL via OROMUCOSAL

## 2023-08-06 MED ORDER — CEFAZOLIN SODIUM-DEXTROSE 2-4 GM/100ML-% IV SOLN
INTRAVENOUS | Status: AC
  Filled 2023-08-06: qty 100

## 2023-08-06 MED ORDER — FENTANYL CITRATE PF 50 MCG/ML IJ SOSY
PREFILLED_SYRINGE | INTRAMUSCULAR | Status: AC
  Filled 2023-08-06: qty 1

## 2023-08-06 MED ORDER — HYDROCODONE-ACETAMINOPHEN 5-325 MG PO TABS: 1.0000 | ORAL_TABLET | ORAL | Status: DC | PRN

## 2023-08-06 SURGICAL SUPPLY — 35 items
BENZOIN TINCTURE PRP APPL 2/3 (GAUZE/BANDAGES/DRESSINGS) ×2 IMPLANT
BNDG COHESIVE 4X5 TAN STRL LF (GAUZE/BANDAGES/DRESSINGS) ×2 IMPLANT
BNDG ELASTIC 4INX 5YD STR LF (GAUZE/BANDAGES/DRESSINGS) ×2 IMPLANT
BNDG ESMARCH 4X12 STRL LF (GAUZE/BANDAGES/DRESSINGS) ×2 IMPLANT
CHLORAPREP W/TINT 26 (MISCELLANEOUS) ×4 IMPLANT
CUFF TOURN SGL QUICK 18X4 (TOURNIQUET CUFF) ×2 IMPLANT
CUFF TRNQT CYL 24X4X16.5-23 (TOURNIQUET CUFF) ×2 IMPLANT
DRAPE FLUOR MINI C-ARM 54X84 (DRAPES) ×2 IMPLANT
DRAPE SURG 17X11 SM STRL (DRAPES) ×2 IMPLANT
DRAPE U-SHAPE 47X51 STRL (DRAPES) ×2 IMPLANT
ELECT REM PT RETURN 9FT ADLT (ELECTROSURGICAL) ×1 IMPLANT
ELECTRODE REM PT RTRN 9FT ADLT (ELECTROSURGICAL) ×2 IMPLANT
GAUZE PAD ABD 8X10 STRL (GAUZE/BANDAGES/DRESSINGS) IMPLANT
GAUZE SPONGE 4X4 12PLY STRL (GAUZE/BANDAGES/DRESSINGS) ×2 IMPLANT
GAUZE XEROFORM 1X8 LF (GAUZE/BANDAGES/DRESSINGS) ×2 IMPLANT
GLOVE BIO SURGEON STRL SZ8 (GLOVE) ×4 IMPLANT
GLOVE INDICATOR 8.0 STRL GRN (GLOVE) ×2 IMPLANT
GOWN STRL REUS W/ TWL LRG LVL3 (GOWN DISPOSABLE) ×4 IMPLANT
IMPL TOGGLELOC ELBOW SYSTEM (Orthopedic Implant) ×2 IMPLANT
IMPLANT TOGGLELOC ELBOW SYSTEM (Orthopedic Implant) ×1 IMPLANT
KIT TURNOVER KIT A (KITS) ×2 IMPLANT
MANIFOLD NEPTUNE II (INSTRUMENTS) ×2 IMPLANT
NS IRRIG 500ML POUR BTL (IV SOLUTION) ×2 IMPLANT
PACK EXTREMITY ARMC (MISCELLANEOUS) ×2 IMPLANT
PAD CAST 4YDX4 CTTN HI CHSV (CAST SUPPLIES) ×2 IMPLANT
PADDING CAST BLEND 4X4 STRL (MISCELLANEOUS) ×4 IMPLANT
SLING ARM LRG DEEP (SOFTGOODS) ×2 IMPLANT
SPLINT PLASTER CAST FAST 5X30 (CAST SUPPLIES) IMPLANT
STOCKINETTE IMPERV 14X48 (MISCELLANEOUS) ×2 IMPLANT
STRIP CLOSURE SKIN 1/4X4 (GAUZE/BANDAGES/DRESSINGS) ×2 IMPLANT
SUT BRDBAND LOOP ST-NDL BL (SUTURE) IMPLANT
SUT VIC AB 2-0 SH 27XBRD (SUTURE) ×2 IMPLANT
SUT VIC AB 3-0 SH 27X BRD (SUTURE) ×2 IMPLANT
TRAP FLUID SMOKE EVACUATOR (MISCELLANEOUS) ×2 IMPLANT
WATER STERILE IRR 500ML POUR (IV SOLUTION) ×2 IMPLANT

## 2023-08-06 NOTE — Anesthesia Preprocedure Evaluation (Addendum)
 Anesthesia Evaluation  Patient identified by MRN, date of birth, ID band Patient awake    Reviewed: Allergy & Precautions, H&P , NPO status , Patient's Chart, lab work & pertinent test results  Airway Mallampati: II  TM Distance: >3 FB Neck ROM: full    Dental no notable dental hx.    Pulmonary sleep apnea , Current Smoker   Pulmonary exam normal        Cardiovascular hypertension, Normal cardiovascular exam     Neuro/Psych negative neurological ROS  negative psych ROS   GI/Hepatic negative GI ROS, Neg liver ROS,,,  Endo/Other  negative endocrine ROS    Renal/GU negative Renal ROS  negative genitourinary   Musculoskeletal  (+) Arthritis ,    Abdominal   Peds  Hematology negative hematology ROS (+)   Anesthesia Other Findings Past Medical History: No date: Arthritis     Comment:  right ankle No date: Chronic back pain No date: History of kidney stones  Past Surgical History: No date: CYST REMOVAL NECK No date: FOOT SURGERY No date: KNEE SURGERY 04/09/2016: LUMBAR LAMINECTOMY/DECOMPRESSION MICRODISCECTOMY; N/A     Comment:  Procedure: left L3-4. L4-5 microdisectomy;  Surgeon:               Karenann Cai, MD;  Location: [facility] ORS;  Service:               Neurosurgery;  Laterality: N/A; 2012: SHOULDER ARTHROSCOPY; Right     Reproductive/Obstetrics negative OB ROS                             Anesthesia Physical Anesthesia Plan  ASA: 2  Anesthesia Plan: General   Post-op Pain Management: Regional block*   Induction: Intravenous  PONV Risk Score and Plan: Propofol infusion and TIVA  Airway Management Planned: Natural Airway  Additional Equipment:   Intra-op Plan:   Post-operative Plan:   Informed Consent: I have reviewed the patients History and Physical, chart, labs and discussed the procedure including the risks, benefits and alternatives for the proposed  anesthesia with the patient or authorized representative who has indicated his/her understanding and acceptance.     Dental Advisory Given  Plan Discussed with: CRNA and Surgeon  Anesthesia Plan Comments:        Anesthesia Quick Evaluation

## 2023-08-06 NOTE — Op Note (Signed)
 08/06/2023  5:01 PM  Patient:   Anthony Ayala  Pre-Op Diagnosis:   Distal biceps tendon rupture, left elbow.  Post-Op Diagnosis:   Same.  Procedure:   Primary repair of ruptured distal biceps tendon, left elbow.  Surgeon:   Maryagnes Amos, MD  Assistant:   None  Anesthesia:   IV sedation with interscalene block  Findings:   As above.  Complications:   None  EBL:   2 cc  Fluids:   200 cc crystalloid  TT:   64 minutes at 250 mmHg  Drains:   None  Closure:   3-0 Vicryl subcuticular sutures  Implants:   Biomet ToggleLoc 1  Brief Clinical Note:   The patient is a 48 year old male who sustained above-noted injury at work 3 days ago. He presented to the orthopedic clinic yesterday and was sent for an urgent MRI scan of the left elbow due to clinical concern for a distal biceps rupture. The MRI scan has confirmed the presence of a partial tear of the left distal biceps tendon. The patient presents at this time for a primary repair of the left distal biceps tendon rupture.  Procedure:   The patient underwent placement of an interscalene block in the preoperative holding area before he was brought into the operating room and lain in the supine position. After adequate IV sedation was achieved, the left upper extremity and hand were prepped with ChloraPrep solution before being draped sterilely. Preoperative antibiotics were administered.  A timeout was performed to verify the appropriate surgical site before the limb was exsanguinated with an Esmarch and the tourniquet inflated to 250 mmHg.   A 6-7 centimeter longitudinal incision was extended distally from the elbow flexion crease. The incision was carried down through the subcutaneous cutaneous tissues with care taken to identify and protect the neurovascular structures in the area. Using blunt dissection, the distal biceps tendon was identified. The lacertus fibrosis was still intact, preventing proximal migration of the tendon, so it  was readily identified. The distal portion of the tendon was debrided of degenerative tissues before a #2 OrthoSorb suture was woven through the tendon. The loop of the Biomet ToggleLoc device was then secured to the distal end of the tendon by tying the ends of this #2 suture tightly.   Attention was directed distally. The bicipital sheath was followed down to the proximal radius using blunt dissection. With care, the bicipital tuberosity was identified by palpation and then dissected free of adjacent soft tissue. While holding the forearm in maximal supination, a guidewire was placed and its position verified using FluoroScan imaging in AP and lateral projections. The guidewire was overreamed with an 8 mm acorn reamer through the anterior cortex before the posterior cortex was overreamed with a 4.5 mm reamer. The ToggleLoc anchor was pushed through the holes drilled into the bicipital tuberosity before it was deployed. The adequacy of fixation of the ToggleLoc anchor was verified by pulling strongly on the sutures. The tendon was cinched into the bony socket with the elbow flexed to 90. The adequacy of anchor position was verified fluoroscopically in AP and lateral projections and found to be excellent. The ToggleLoc sutures were then cut and removed. The elbow was ranged passively and the repair was deemed to be stable to within 20 of extension.  The wound was copiously irrigated with sterile saline solution before the subcutaneous tissues were closed with 2-0 Vicryl interrupted sutures. 3-0 Vicryl subcuticular sutures were used to close the skin before Benzoin  and steri-strips were applied to the skin. A total of 15 cc of 0.5% plain Sensorcaine was injected in and around the incision site. A sterile bulky dressing was applied to the elbow before the arm was placed into a posterior splint maintaining the elbow at approximately 90 of flexion and in neutral rotation. The patient was then awakened and  returned to the recovery room in satisfactory condition after tolerating the procedure well.

## 2023-08-06 NOTE — Discharge Instructions (Addendum)
 Orthopedic discharge instructions: Keep splint dry and intact. Keep hand elevated above heart level. Apply ice to affected area frequently. Take ibuprofen 800 mg TID with meals for 5-7 days, then as necessary. Take pain medication as prescribed or ES Tylenol when needed.  Return for follow-up in 10-14 days or as scheduled.

## 2023-08-06 NOTE — Transfer of Care (Signed)
 Immediate Anesthesia Transfer of Care Note  Patient: Anthony Ayala  Procedure(s) Performed: REPAIR, TENDON, BICEPS, DISTAL (Left: Elbow)  Patient Location: PACU  Anesthesia Type:Regional  Level of Consciousness: drowsy  Airway & Oxygen Therapy: Patient Spontanous Breathing and Patient connected to face mask oxygen  Post-op Assessment: Report given to RN  Post vital signs: stable  Last Vitals:  Vitals Value Taken Time  BP 92/59 08/06/23 1654  Temp    Pulse 63 08/06/23 1657  Resp 18 08/06/23 1657  SpO2 100 % 08/06/23 1657  Vitals shown include unfiled device data.  Last Pain:  Vitals:   08/06/23 1314  TempSrc: Oral  PainSc: 0-No pain         Complications: No notable events documented.

## 2023-08-06 NOTE — Anesthesia Postprocedure Evaluation (Signed)
 Anesthesia Post Note  Patient: Health visitor  Procedure(s) Performed: REPAIR, TENDON, BICEPS, DISTAL (Left: Elbow)  Patient location during evaluation: PACU Anesthesia Type: General Level of consciousness: awake and alert Pain management: pain level controlled Vital Signs Assessment: post-procedure vital signs reviewed and stable Respiratory status: spontaneous breathing, nonlabored ventilation, respiratory function stable and patient connected to nasal cannula oxygen Cardiovascular status: blood pressure returned to baseline and stable Postop Assessment: no apparent nausea or vomiting Anesthetic complications: no   No notable events documented.   Last Vitals:  Vitals:   08/06/23 1841 08/06/23 1856  BP: (!) 88/68 99/70  Pulse:    Resp:    Temp:    SpO2:      Last Pain:  Vitals:   08/06/23 1750  TempSrc: Temporal  PainSc: 0-No pain                 Lenard Simmer

## 2023-08-06 NOTE — H&P (Signed)
 History of Present Illness: Anthony Ayala is a 48 y.o. male who presents today for evaluation of a left elbow injury sustained on 08/03/2023. 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 There is a new resident that was moving in, he was helping get the bed set up when he lifted an object and felt a pop and immediate pain in his left elbow. The patient reported limited movement and because of the immediate discomfort he did go to urgent care. Urgent care was unable to perform the imaging needed so it was recommend that he proceed to the emergency room. At the emergency room the patient was diagnosed with a questionable left distal biceps rupture an MRI scan was ordered however the MRI was of the patient's humerus not of his elbow, because of this it was difficult to determine if the patient indeed had a distal biceps tendon rupture. The patient continues report increased pain along the left elbow with weakness when trying to grip and lift objects. He has not noticed significant bruising or deformity at this time. He is right-hand dominant. No surgical history involving left elbow. The patient denies any personal history of heart attack, stroke, asthma or COPD. No personal history of blood clots. He is not diabetic. Does report an occasional burning discomfort left elbow and forearm.  Past Medical History: No past medical history on file.  Past Surgical History: Left L3-4 L4-5 microdiscectomy 04/09/2016 (Dr. Emogene Morgan)  ARTHROSCOPY HIP Left 04/08/2017  Procedure: ([facility]) ARTHROSCOPY, HIP WITH FEMOROPLASTY (IE, TREATMENT OF CAM LESION); Surgeon: Haydee Salter, MD; Location: [facility] OR; Service: Orthopedics; Laterality: Left;  ARTHROSCOPY HIP Left 04/08/2017  Procedure: ARTHROSCOPY, HIP WITH LABRAL REPAIR; Surgeon: Haydee Salter, MD; Location: [facility] OR; Service: Orthopedics; Laterality: Left;  BACK SURGERY  cyst removal neck  foot  surgery  knee surgery Bilateral   Past Family History: Diabetes type II Mother  Sleep apnea Mother  Depression Mother  Anesthesia problems Neg Hx   Medications: ibuprofen (ADVIL,MOTRIN) 600 MG tablet Take 600 mg by mouth every 6 (six) hours as needed for Pain  losartan (COZAAR) 100 MG tablet Take 100 mg by mouth once daily   Allergies: Iodinated Contrast Media Anaphylaxis  Penicillins Hives  Has patient had a PCN reaction causing immediate rash, facial/tongue/throat swelling, SOB or lightheadedness with hypotension: unknown Has patient had a PCN reaction causing severe rash involving mucus membranes or skin necrosis:unknow Has patient had a PCN reaction that required hospitalization No Has patient had a PCN reaction occurring within the last 10 years: No If all of the above answers are "NO", then may proceed with Cephalosporin use.   Review of Systems:  A comprehensive 14 point ROS was performed, reviewed by me today, and the pertinent orthopaedic findings are documented in the HPI.  Physical Exam: BP 130/76  Ht 174 cm (5' 8.5")  Wt (!) 106.6 kg (235 lb)  BMI 35.21 kg/m  General/Constitutional: The patient appears to be well-nourished, well-developed, and in no acute distress. Neuro/Psych: Normal mood and affect, oriented to person, place and time. Eyes: Non-icteric. Pupils are equal, round, and reactive to light, and exhibit synchronous movement. ENT: Unremarkable. Lymphatic: No palpable adenopathy. Respiratory: Lungs clear to auscultation, Normal chest excursion, No wheezes, and Non-labored breathing Cardiovascular: Regular rate and rhythm. No murmurs. and No edema, swelling or tenderness, except as noted in detailed exam. Integumentary: No impressive skin lesions present, except as noted in detailed exam. Musculoskeletal: Unremarkable,  except as noted in detailed exam.  The patient presents today with a shoulder sling applied to the left upper extremity. Skin examination  left elbow does reveal a mild deformity along the distal bicep however the bicep can be palpated. The patient has significant pain with palpation along the distal bicep tendon and at the insertion site. Although the distal biceps tendon can be palpated with hook test it does feel thickened in comparison to the right distal bicep tendon. He is able to tolerate gentle passive left elbow flexion extension with increased pain in the extremes of extension. Moderate pain and weakness with resisted supination in addition to flexion of the left arm. He is intact light touch throughout left upper extremity. Cap refills intact to each individual digit. Radial and ulnar pulse are intact to left upper extremity.  Imaging: None.  Impression: 1.  Acute rupture of left distal biceps tendon.  Plan:  1. Treatment options were discussed today with the patient. 2. Patient presents today with likely left distal bicep tendon rupture. 3. A stat MRI scan of the left elbow has been ordered for the patient at this time. We discussed that if the MRI scan does demonstrate a complete distal bicep tendon rupture versus a almost complete distal biceps tendon rupture the best option would be to proceed with surgical intervention to repair the tendon. 4. The risk and benefits of a distal bicep tendon repair were discussed in detail at today's visit. Depending on when the MRI scan can be obtained and Worker's Comp. approval obtained plan will be to add the patient to the surgery scheduled tomorrow with Dr. Joice Lofts. 5. This document will serve as a surgical history and physical for the patient. 6. The patient will follow-up per standard postop protocol. They can call the clinic they have any questions, new symptoms develop or symptoms worsen.  The procedure was discussed with the patient, as were the potential risks (including bleeding, infection, nerve and/or blood vessel injury, persistent or recurrent pain, failure of the repair,  progression of arthritis, need for further surgery, blood clots, strokes, heart attacks and/or arhythmias, pneumonia, etc.) and benefits. The patient states his understanding and wishes to proceed.    H&P reviewed and patient re-examined. No changes.

## 2023-08-06 NOTE — Anesthesia Procedure Notes (Signed)
 Anesthesia Regional Block: Interscalene brachial plexus block   Pre-Anesthetic Checklist: , timeout performed,  Correct Patient, Correct Site, Correct Laterality,  Correct Procedure, Correct Position, site marked,  Risks and benefits discussed,  Surgical consent,  Pre-op evaluation,  At surgeon's request and post-op pain management  Laterality: Upper and Left  Prep: chloraprep       Needles:  Injection technique: Single-shot  Needle Type: Stimiplex     Needle Length: 9cm  Needle Gauge: 22     Additional Needles:   Procedures:,,,, ultrasound used (permanent image in chart),,    Narrative:  Start time: 08/06/2023 2:39 PM End time: 08/06/2023 2:43 PM Injection made incrementally with aspirations every 5 mL.  Performed by: Personally  Anesthesiologist: Foye Deer, MD  Additional Notes: Patient consented for risk and benefits of nerve block including but not limited to nerve damage, failed block, bleeding and infection.  Patient voiced understanding.  Functioning IV was confirmed and monitors were applied.  Timeout done prior to procedure and prior to any sedation being given to the patient.  Patient confirmed procedure site prior to any sedation given to the patient. Sterile prep,hand hygiene and sterile gloves were used.  Minimal sedation used for procedure.  No paresthesia endorsed by patient during the procedure.  Negative aspiration and negative test dose prior to incremental administration of local anesthetic. The patient tolerated the procedure well with no immediate complications.

## 2023-08-07 ENCOUNTER — Encounter: Payer: Self-pay | Admitting: Surgery

## 2023-08-31 ENCOUNTER — Encounter: Payer: Self-pay | Admitting: Surgery

## 2023-10-27 ENCOUNTER — Ambulatory Visit
Admission: RE | Admit: 2023-10-27 | Discharge: 2023-10-27 | Disposition: A | Discharge status: Home / Self Care | Payer: Worker's Compensation | Source: Ambulatory Visit | Attending: Internal Medicine | Admitting: Internal Medicine

## 2023-10-27 ENCOUNTER — Other Ambulatory Visit: Payer: Self-pay | Admitting: Internal Medicine

## 2023-10-27 DIAGNOSIS — N200 Calculus of kidney: Secondary | ICD-10-CM | POA: Insufficient documentation
